# Patient Record
Sex: Female | Born: 1950 | Race: Black or African American | Hispanic: No | Marital: Single | Smoking: Former smoker
Health system: Southern US, Community
[De-identification: ages and names within clinical notes are randomized; demographics above are authoritative.]

## PROBLEM LIST (undated history)

## (undated) DIAGNOSIS — J811 Chronic pulmonary edema: Secondary | ICD-10-CM

## (undated) DIAGNOSIS — Z780 Asymptomatic menopausal state: Secondary | ICD-10-CM

## (undated) DIAGNOSIS — R739 Hyperglycemia, unspecified: Secondary | ICD-10-CM

## (undated) DIAGNOSIS — E7800 Pure hypercholesterolemia, unspecified: Secondary | ICD-10-CM

## (undated) DIAGNOSIS — E119 Type 2 diabetes mellitus without complications: Secondary | ICD-10-CM

## (undated) DIAGNOSIS — M479 Spondylosis, unspecified: Secondary | ICD-10-CM

## (undated) DIAGNOSIS — N281 Cyst of kidney, acquired: Secondary | ICD-10-CM

## (undated) DIAGNOSIS — K219 Gastro-esophageal reflux disease without esophagitis: Secondary | ICD-10-CM

## (undated) DIAGNOSIS — I10 Essential (primary) hypertension: Secondary | ICD-10-CM

## (undated) HISTORY — PX: ABDOMINAL HYSTERECTOMY: SHX81

## (undated) HISTORY — DX: Essential (primary) hypertension: I10

## (undated) HISTORY — DX: Pure hypercholesterolemia, unspecified: E78.00

## (undated) HISTORY — PX: CYST EXCISION: SHX5701

## (undated) HISTORY — DX: Cyst of kidney, acquired: N28.1

## (undated) HISTORY — DX: Chronic pulmonary edema: J81.1

## (undated) HISTORY — PX: TUBAL LIGATION: SHX77

## (undated) HISTORY — DX: Gastro-esophageal reflux disease without esophagitis: K21.9

## (undated) HISTORY — DX: Asymptomatic menopausal state: Z78.0

## (undated) HISTORY — DX: Spondylosis, unspecified: M47.9

## (undated) HISTORY — DX: Hyperglycemia, unspecified: R73.9

## (undated) HISTORY — DX: Type 2 diabetes mellitus without complications: E11.9

---

## 1999-03-28 ENCOUNTER — Emergency Department (HOSPITAL_COMMUNITY): Admission: EM | Admit: 1999-03-28 | Discharge: 1999-03-28 | Payer: Self-pay | Admitting: Emergency Medicine

## 1999-06-19 ENCOUNTER — Emergency Department (HOSPITAL_COMMUNITY): Admission: EM | Admit: 1999-06-19 | Discharge: 1999-06-19 | Payer: Self-pay | Admitting: Emergency Medicine

## 1999-11-20 ENCOUNTER — Encounter: Payer: Self-pay | Admitting: Family Medicine

## 1999-11-20 ENCOUNTER — Encounter: Admission: RE | Admit: 1999-11-20 | Discharge: 1999-11-20 | Payer: Self-pay | Admitting: Family Medicine

## 2000-11-23 ENCOUNTER — Encounter: Admission: RE | Admit: 2000-11-23 | Discharge: 2000-11-23 | Payer: Self-pay | Admitting: Endocrinology

## 2000-11-23 ENCOUNTER — Encounter: Payer: Self-pay | Admitting: Endocrinology

## 2001-03-23 ENCOUNTER — Emergency Department (HOSPITAL_COMMUNITY): Admission: EM | Admit: 2001-03-23 | Discharge: 2001-03-23 | Payer: Self-pay | Admitting: Emergency Medicine

## 2001-11-24 ENCOUNTER — Encounter: Admission: RE | Admit: 2001-11-24 | Discharge: 2001-11-24 | Payer: Self-pay | Admitting: Endocrinology

## 2001-11-24 ENCOUNTER — Encounter: Payer: Self-pay | Admitting: Endocrinology

## 2002-04-02 LAB — HM COLONOSCOPY

## 2002-05-19 ENCOUNTER — Emergency Department (HOSPITAL_COMMUNITY): Admission: EM | Admit: 2002-05-19 | Discharge: 2002-05-19 | Payer: Self-pay | Admitting: Emergency Medicine

## 2002-11-26 ENCOUNTER — Encounter: Payer: Self-pay | Admitting: Endocrinology

## 2002-11-26 ENCOUNTER — Encounter: Admission: RE | Admit: 2002-11-26 | Discharge: 2002-11-26 | Payer: Self-pay | Admitting: Endocrinology

## 2002-12-19 ENCOUNTER — Other Ambulatory Visit: Admission: RE | Admit: 2002-12-19 | Discharge: 2002-12-19 | Payer: Self-pay | Admitting: Endocrinology

## 2003-05-24 ENCOUNTER — Ambulatory Visit (HOSPITAL_COMMUNITY): Admission: RE | Admit: 2003-05-24 | Discharge: 2003-05-24 | Payer: Self-pay | Admitting: Endocrinology

## 2003-05-24 ENCOUNTER — Encounter: Payer: Self-pay | Admitting: Endocrinology

## 2003-11-29 ENCOUNTER — Ambulatory Visit (HOSPITAL_COMMUNITY): Admission: RE | Admit: 2003-11-29 | Discharge: 2003-11-29 | Payer: Self-pay | Admitting: Endocrinology

## 2004-01-06 ENCOUNTER — Other Ambulatory Visit: Admission: RE | Admit: 2004-01-06 | Discharge: 2004-01-06 | Payer: Self-pay | Admitting: Endocrinology

## 2004-02-05 ENCOUNTER — Ambulatory Visit (HOSPITAL_COMMUNITY): Admission: RE | Admit: 2004-02-05 | Discharge: 2004-02-05 | Payer: Self-pay | Admitting: Endocrinology

## 2004-12-28 ENCOUNTER — Ambulatory Visit (HOSPITAL_COMMUNITY): Admission: RE | Admit: 2004-12-28 | Discharge: 2004-12-28 | Payer: Self-pay | Admitting: Endocrinology

## 2005-05-18 ENCOUNTER — Ambulatory Visit: Payer: Self-pay | Admitting: Endocrinology

## 2005-07-08 ENCOUNTER — Ambulatory Visit: Payer: Self-pay | Admitting: Endocrinology

## 2005-07-12 ENCOUNTER — Ambulatory Visit: Payer: Self-pay | Admitting: Endocrinology

## 2005-07-19 ENCOUNTER — Encounter (INDEPENDENT_AMBULATORY_CARE_PROVIDER_SITE_OTHER): Payer: Self-pay | Admitting: *Deleted

## 2005-07-19 ENCOUNTER — Ambulatory Visit: Payer: Self-pay | Admitting: Endocrinology

## 2005-07-19 ENCOUNTER — Other Ambulatory Visit: Admission: RE | Admit: 2005-07-19 | Discharge: 2005-07-19 | Payer: Self-pay | Admitting: Obstetrics and Gynecology

## 2005-10-21 ENCOUNTER — Ambulatory Visit: Payer: Self-pay | Admitting: Endocrinology

## 2005-11-18 ENCOUNTER — Ambulatory Visit: Payer: Self-pay | Admitting: Endocrinology

## 2005-11-19 ENCOUNTER — Ambulatory Visit: Payer: Self-pay

## 2005-12-29 ENCOUNTER — Ambulatory Visit: Payer: Self-pay | Admitting: Endocrinology

## 2006-01-11 ENCOUNTER — Ambulatory Visit: Payer: Self-pay | Admitting: Internal Medicine

## 2006-01-28 ENCOUNTER — Ambulatory Visit (HOSPITAL_COMMUNITY): Admission: RE | Admit: 2006-01-28 | Discharge: 2006-01-28 | Payer: Self-pay | Admitting: Endocrinology

## 2006-04-16 ENCOUNTER — Inpatient Hospital Stay (HOSPITAL_COMMUNITY): Admission: AD | Admit: 2006-04-16 | Discharge: 2006-04-17 | Payer: Self-pay | Admitting: Gynecology

## 2006-06-13 ENCOUNTER — Emergency Department (HOSPITAL_COMMUNITY): Admission: EM | Admit: 2006-06-13 | Discharge: 2006-06-13 | Payer: Self-pay | Admitting: Emergency Medicine

## 2006-08-18 ENCOUNTER — Ambulatory Visit: Payer: Self-pay | Admitting: Endocrinology

## 2006-09-12 ENCOUNTER — Ambulatory Visit: Payer: Self-pay | Admitting: Endocrinology

## 2006-09-12 LAB — CONVERTED CEMR LAB
AST: 18 units/L (ref 0–37)
Albumin: 4.1 g/dL (ref 3.5–5.2)
Alkaline Phosphatase: 98 units/L (ref 39–117)
Basophils Absolute: 0 10*3/uL (ref 0.0–0.1)
Bilirubin Urine: NEGATIVE
CO2: 32 meq/L (ref 19–32)
Calcium: 9.7 mg/dL (ref 8.4–10.5)
Cholesterol: 253 mg/dL (ref 0–200)
Creatinine, Ser: 0.8 mg/dL (ref 0.4–1.2)
GFR calc non Af Amer: 80 mL/min
Glucose, Bld: 94 mg/dL (ref 70–99)
HDL: 67.8 mg/dL (ref >39.0)
Hemoglobin: 14.2 g/dL (ref 12.0–15.0)
MCV: 79.7 fL (ref 78.0–100.0)
Monocytes Absolute: 0.5 10*3/uL (ref 0.2–0.7)
Neutro Abs: 3.6 10*3/uL (ref 1.4–7.7)
Neutrophils Relative %: 60.1 % (ref 43.0–77.0)
Platelets: 248 10*3/uL (ref 150–400)
RBC: 5.34 M/uL — ABNORMAL HIGH (ref 3.87–5.11)
Sodium: 139 meq/L (ref 135–145)
TSH: 1.31 microintl units/mL (ref 0.35–5.50)
Total Bilirubin: 1 mg/dL (ref 0.3–1.2)
Total Protein: 7.4 g/dL (ref 6.0–8.3)
Urine Glucose: NEGATIVE mg/dL
VLDL: 16 mg/dL (ref 0–40)

## 2006-09-16 ENCOUNTER — Ambulatory Visit: Payer: Self-pay | Admitting: Endocrinology

## 2007-02-03 ENCOUNTER — Ambulatory Visit (HOSPITAL_COMMUNITY): Admission: RE | Admit: 2007-02-03 | Discharge: 2007-02-03 | Payer: Self-pay | Admitting: Endocrinology

## 2007-05-25 ENCOUNTER — Encounter: Payer: Self-pay | Admitting: Endocrinology

## 2007-05-25 DIAGNOSIS — I10 Essential (primary) hypertension: Secondary | ICD-10-CM

## 2007-05-25 DIAGNOSIS — K219 Gastro-esophageal reflux disease without esophagitis: Secondary | ICD-10-CM | POA: Insufficient documentation

## 2007-05-29 ENCOUNTER — Ambulatory Visit: Payer: Self-pay | Admitting: Endocrinology

## 2007-05-29 LAB — CONVERTED CEMR LAB
BUN: 14 mg/dL (ref 6–23)
Basophils Relative: 0.5 % (ref 0.0–1.0)
CO2: 32 meq/L (ref 19–32)
Calcium: 9.6 mg/dL (ref 8.4–10.5)
Chloride: 106 meq/L (ref 96–112)
Eosinophils Absolute: 0.2 10*3/uL (ref 0.0–0.6)
GFR calc Af Amer: 113 mL/min
Glucose, Bld: 94 mg/dL (ref 70–99)
HCT: 39.5 % (ref 36.0–46.0)
Hemoglobin: 13.3 g/dL (ref 12.0–15.0)
Hgb A1c MFr Bld: 5.8 % (ref 4.6–6.0)
MCV: 77.5 fL — ABNORMAL LOW (ref 78.0–100.0)
Monocytes Relative: 8.5 % (ref 3.0–11.0)
Neutro Abs: 3.7 10*3/uL (ref 1.4–7.7)
Potassium: 4 meq/L (ref 3.5–5.1)
RBC: 5.09 M/uL (ref 3.87–5.11)
Sed Rate: 10 mm/hr (ref 0–25)
Sodium: 143 meq/L (ref 135–145)
WBC: 6 10*3/uL (ref 4.5–10.5)

## 2007-05-30 ENCOUNTER — Emergency Department (HOSPITAL_COMMUNITY): Admission: EM | Admit: 2007-05-30 | Discharge: 2007-05-30 | Payer: Self-pay | Admitting: Emergency Medicine

## 2007-06-09 ENCOUNTER — Emergency Department (HOSPITAL_COMMUNITY): Admission: EM | Admit: 2007-06-09 | Discharge: 2007-06-09 | Payer: Self-pay | Admitting: Emergency Medicine

## 2007-06-13 ENCOUNTER — Ambulatory Visit: Payer: Self-pay | Admitting: Endocrinology

## 2007-06-27 ENCOUNTER — Ambulatory Visit: Payer: Self-pay | Admitting: Endocrinology

## 2007-07-27 ENCOUNTER — Ambulatory Visit: Payer: Self-pay | Admitting: Endocrinology

## 2007-10-20 ENCOUNTER — Ambulatory Visit: Payer: Self-pay | Admitting: Endocrinology

## 2007-10-20 LAB — CONVERTED CEMR LAB
AST: 16 units/L (ref 0–37)
Albumin: 4.2 g/dL (ref 3.5–5.2)
Alkaline Phosphatase: 92 units/L (ref 39–117)
Basophils Absolute: 0 10*3/uL (ref 0.0–0.1)
Bilirubin Urine: NEGATIVE
CO2: 31 meq/L (ref 19–32)
Calcium: 9.5 mg/dL (ref 8.4–10.5)
Creatinine, Ser: 0.7 mg/dL (ref 0.4–1.2)
Direct LDL: 139.5 mg/dL
Eosinophils Relative: 2.9 % (ref 0.0–5.0)
HCT: 39.9 % (ref 36.0–46.0)
Hemoglobin: 13.2 g/dL (ref 12.0–15.0)
MCV: 79.9 fL (ref 78.0–100.0)
Monocytes Absolute: 0.5 10*3/uL (ref 0.2–0.7)
Neutro Abs: 3.9 10*3/uL (ref 1.4–7.7)
Neutrophils Relative %: 65.4 % (ref 43.0–77.0)
Nitrite: NEGATIVE
Platelets: 208 10*3/uL (ref 150–400)
RBC: 4.99 M/uL (ref 3.87–5.11)
RDW: 13.7 % (ref 11.5–14.6)
Sodium: 139 meq/L (ref 135–145)
Specific Gravity, Urine: >=1.03 (ref 1.000–1.03)
TSH: 0.64 microintl units/mL (ref 0.35–5.50)
Total Bilirubin: 1 mg/dL (ref 0.3–1.2)
Total Protein: 7.3 g/dL (ref 6.0–8.3)
Triglycerides: 74 mg/dL (ref 0–149)
Urobilinogen, UA: 0.2 (ref 0.0–1.0)
WBC: 6 10*3/uL (ref 4.5–10.5)
pH: 5.5 (ref 5.0–8.0)

## 2007-10-22 ENCOUNTER — Emergency Department (HOSPITAL_COMMUNITY): Admission: EM | Admit: 2007-10-22 | Discharge: 2007-10-22 | Payer: Self-pay | Admitting: Emergency Medicine

## 2007-10-24 ENCOUNTER — Ambulatory Visit: Payer: Self-pay | Admitting: Endocrinology

## 2007-10-24 DIAGNOSIS — Z780 Asymptomatic menopausal state: Secondary | ICD-10-CM | POA: Insufficient documentation

## 2007-10-24 DIAGNOSIS — E7800 Pure hypercholesterolemia, unspecified: Secondary | ICD-10-CM

## 2007-10-25 ENCOUNTER — Encounter: Payer: Self-pay | Admitting: Endocrinology

## 2007-10-25 ENCOUNTER — Ambulatory Visit: Payer: Self-pay | Admitting: Internal Medicine

## 2008-02-02 ENCOUNTER — Ambulatory Visit (HOSPITAL_COMMUNITY): Admission: RE | Admit: 2008-02-02 | Discharge: 2008-02-02 | Payer: Self-pay | Admitting: Endocrinology

## 2008-03-13 ENCOUNTER — Ambulatory Visit: Payer: Self-pay | Admitting: Endocrinology

## 2008-03-13 DIAGNOSIS — R51 Headache: Secondary | ICD-10-CM

## 2008-03-13 DIAGNOSIS — R519 Headache, unspecified: Secondary | ICD-10-CM | POA: Insufficient documentation

## 2008-04-01 ENCOUNTER — Ambulatory Visit: Payer: Self-pay | Admitting: Endocrinology

## 2008-04-01 DIAGNOSIS — IMO0001 Reserved for inherently not codable concepts without codable children: Secondary | ICD-10-CM

## 2008-04-01 LAB — CONVERTED CEMR LAB
Calcium: 9.8 mg/dL (ref 8.4–10.5)
Chloride: 104 meq/L (ref 96–112)
Cholesterol: 209 mg/dL (ref 0–200)
Creatinine, Ser: 0.7 mg/dL (ref 0.4–1.2)
Direct LDL: 117.8 mg/dL
GFR calc Af Amer: 112 mL/min
GFR calc non Af Amer: 93 mL/min
Glucose, Bld: 82 mg/dL (ref 70–99)
Sed Rate: 17 mm/hr (ref 0–22)
Total CK: 382 units/L (ref 7–177)

## 2008-06-24 ENCOUNTER — Ambulatory Visit: Payer: Self-pay | Admitting: Endocrinology

## 2008-06-24 ENCOUNTER — Encounter (INDEPENDENT_AMBULATORY_CARE_PROVIDER_SITE_OTHER): Payer: Self-pay | Admitting: *Deleted

## 2008-06-24 DIAGNOSIS — R079 Chest pain, unspecified: Secondary | ICD-10-CM

## 2008-06-24 LAB — CONVERTED CEMR LAB
Eosinophils Absolute: 0.2 10*3/uL (ref 0.0–0.7)
Hemoglobin: 13.1 g/dL (ref 12.0–15.0)
Lymphocytes Relative: 22.8 % (ref 12.0–46.0)
MCV: 79.1 fL (ref 78.0–100.0)
Monocytes Absolute: 0.6 10*3/uL (ref 0.1–1.0)
RBC: 4.86 M/uL (ref 3.87–5.11)
WBC: 6.7 10*3/uL (ref 4.5–10.5)

## 2008-06-27 ENCOUNTER — Ambulatory Visit: Payer: Self-pay

## 2008-07-25 ENCOUNTER — Ambulatory Visit: Payer: Self-pay | Admitting: Internal Medicine

## 2008-07-25 DIAGNOSIS — R05 Cough: Secondary | ICD-10-CM

## 2008-07-29 ENCOUNTER — Encounter (INDEPENDENT_AMBULATORY_CARE_PROVIDER_SITE_OTHER): Payer: Self-pay | Admitting: *Deleted

## 2008-08-02 ENCOUNTER — Telehealth (INDEPENDENT_AMBULATORY_CARE_PROVIDER_SITE_OTHER): Payer: Self-pay | Admitting: *Deleted

## 2008-08-02 ENCOUNTER — Encounter: Payer: Self-pay | Admitting: Endocrinology

## 2008-08-19 ENCOUNTER — Ambulatory Visit: Payer: Self-pay | Admitting: Endocrinology

## 2008-09-04 ENCOUNTER — Ambulatory Visit: Payer: Self-pay | Admitting: Internal Medicine

## 2008-10-21 ENCOUNTER — Ambulatory Visit: Payer: Self-pay | Admitting: Internal Medicine

## 2008-10-21 DIAGNOSIS — R109 Unspecified abdominal pain: Secondary | ICD-10-CM

## 2008-10-21 LAB — CONVERTED CEMR LAB
Bacteria, UA: NEGATIVE
Bilirubin Urine: NEGATIVE
Hemoglobin, Urine: NEGATIVE
Ketones, ur: NEGATIVE mg/dL
Leukocytes, UA: NEGATIVE
Nitrite: NEGATIVE
RBC / HPF: NONE SEEN
Specific Gravity, Urine: 1.01 (ref 1.000–1.03)
Total Protein, Urine: NEGATIVE mg/dL

## 2009-02-06 ENCOUNTER — Ambulatory Visit: Payer: Self-pay | Admitting: Endocrinology

## 2009-02-06 LAB — CONVERTED CEMR LAB
AST: 16 units/L (ref 0–37)
Albumin: 4.3 g/dL (ref 3.5–5.2)
Alkaline Phosphatase: 94 units/L (ref 39–117)
BUN: 14 mg/dL (ref 6–23)
Bilirubin Urine: NEGATIVE
CO2: 30 meq/L (ref 19–32)
Calcium: 9.7 mg/dL (ref 8.4–10.5)
Chloride: 104 meq/L (ref 96–112)
Direct LDL: 160 mg/dL
Eosinophils Absolute: 0.2 10*3/uL (ref 0.0–0.7)
Eosinophils Relative: 3.3 % (ref 0.0–5.0)
GFR calc non Af Amer: 111.51 mL/min (ref >60)
Glucose, Bld: 85 mg/dL (ref 70–99)
HDL: 71.5 mg/dL (ref >39.00)
Ketones, ur: NEGATIVE mg/dL
Lymphs Abs: 1.2 10*3/uL (ref 0.7–4.0)
MCV: 79.2 fL (ref 78.0–100.0)
Monocytes Absolute: 0.5 10*3/uL (ref 0.1–1.0)
Monocytes Relative: 9.1 % (ref 3.0–12.0)
Neutrophils Relative %: 65.3 % (ref 43.0–77.0)
Potassium: 3.7 meq/L (ref 3.5–5.1)
RDW: 13.9 % (ref 11.5–14.6)
Sodium: 141 meq/L (ref 135–145)
Total Bilirubin: 1 mg/dL (ref 0.3–1.2)
Total Protein: 7.3 g/dL (ref 6.0–8.3)
Urine Glucose: NEGATIVE mg/dL
Urobilinogen, UA: 0.2 (ref 0.0–1.0)

## 2009-02-07 ENCOUNTER — Other Ambulatory Visit: Admission: RE | Admit: 2009-02-07 | Discharge: 2009-02-07 | Payer: Self-pay | Admitting: Endocrinology

## 2009-02-07 ENCOUNTER — Ambulatory Visit: Payer: Self-pay | Admitting: Endocrinology

## 2009-02-07 ENCOUNTER — Encounter: Payer: Self-pay | Admitting: Endocrinology

## 2009-02-13 ENCOUNTER — Ambulatory Visit (HOSPITAL_COMMUNITY): Admission: RE | Admit: 2009-02-13 | Discharge: 2009-02-13 | Payer: Self-pay | Admitting: Endocrinology

## 2009-03-07 ENCOUNTER — Emergency Department (HOSPITAL_COMMUNITY): Admission: EM | Admit: 2009-03-07 | Discharge: 2009-03-07 | Payer: Self-pay | Admitting: Emergency Medicine

## 2009-03-11 ENCOUNTER — Ambulatory Visit: Payer: Self-pay | Admitting: Endocrinology

## 2009-03-11 DIAGNOSIS — N281 Cyst of kidney, acquired: Secondary | ICD-10-CM

## 2009-03-11 DIAGNOSIS — M479 Spondylosis, unspecified: Secondary | ICD-10-CM | POA: Insufficient documentation

## 2009-04-16 ENCOUNTER — Ambulatory Visit: Payer: Self-pay | Admitting: Internal Medicine

## 2009-04-16 ENCOUNTER — Encounter (INDEPENDENT_AMBULATORY_CARE_PROVIDER_SITE_OTHER): Payer: Self-pay | Admitting: *Deleted

## 2009-04-21 ENCOUNTER — Ambulatory Visit: Payer: Self-pay | Admitting: Internal Medicine

## 2009-04-25 ENCOUNTER — Ambulatory Visit: Payer: Self-pay | Admitting: Cardiology

## 2009-05-02 ENCOUNTER — Ambulatory Visit: Payer: Self-pay | Admitting: Endocrinology

## 2009-05-05 LAB — CONVERTED CEMR LAB
ALT: 17 units/L (ref 0–35)
Albumin: 4.2 g/dL (ref 3.5–5.2)
Alkaline Phosphatase: 89 units/L (ref 39–117)
Cholesterol: 137 mg/dL (ref 0–200)
HDL: 67.9 mg/dL (ref >39.00)
Total Bilirubin: 1 mg/dL (ref 0.3–1.2)
Total CHOL/HDL Ratio: 2

## 2009-09-02 ENCOUNTER — Ambulatory Visit: Payer: Self-pay | Admitting: Internal Medicine

## 2009-09-02 DIAGNOSIS — J029 Acute pharyngitis, unspecified: Secondary | ICD-10-CM

## 2009-09-07 ENCOUNTER — Emergency Department (HOSPITAL_COMMUNITY): Admission: EM | Admit: 2009-09-07 | Discharge: 2009-09-07 | Payer: Self-pay | Admitting: *Deleted

## 2009-10-07 ENCOUNTER — Encounter: Admission: RE | Admit: 2009-10-07 | Discharge: 2009-10-07 | Payer: Self-pay | Admitting: Internal Medicine

## 2009-12-03 ENCOUNTER — Ambulatory Visit: Payer: Self-pay | Admitting: Endocrinology

## 2009-12-05 ENCOUNTER — Encounter: Payer: Self-pay | Admitting: Endocrinology

## 2009-12-11 ENCOUNTER — Ambulatory Visit: Payer: Self-pay | Admitting: Endocrinology

## 2010-07-03 ENCOUNTER — Ambulatory Visit: Payer: Self-pay | Admitting: Endocrinology

## 2010-07-03 ENCOUNTER — Encounter: Payer: Self-pay | Admitting: Endocrinology

## 2010-07-03 LAB — CONVERTED CEMR LAB
ALT: 17 units/L (ref 0–35)
BUN: 17 mg/dL (ref 6–23)
Basophils Relative: 0.4 % (ref 0.0–3.0)
Bilirubin Urine: NEGATIVE
CO2: 31 meq/L (ref 19–32)
Calcium: 10 mg/dL (ref 8.4–10.5)
Creatinine, Ser: 0.8 mg/dL (ref 0.4–1.2)
Eosinophils Absolute: 0.2 10*3/uL (ref 0.0–0.7)
Eosinophils Relative: 1.5 % (ref 0.0–5.0)
GFR calc non Af Amer: 93.75 mL/min (ref >60)
HCT: 41.4 % (ref 36.0–46.0)
Hemoglobin: 14 g/dL (ref 12.0–15.0)
LDL Cholesterol: 77 mg/dL (ref 0–99)
Leukocytes, UA: NEGATIVE
MCV: 78.6 fL (ref 78.0–100.0)
Monocytes Absolute: 0.8 10*3/uL (ref 0.1–1.0)
Monocytes Relative: 8.3 % (ref 3.0–12.0)
Neutrophils Relative %: 67.3 % (ref 43.0–77.0)
Nitrite: NEGATIVE
Potassium: 3.7 meq/L (ref 3.5–5.1)
RDW: 15.4 % — ABNORMAL HIGH (ref 11.5–14.6)
Specific Gravity, Urine: >=1.03 (ref 1.000–1.030)
Total Bilirubin: 1 mg/dL (ref 0.3–1.2)
VLDL: 11.6 mg/dL (ref 0.0–40.0)
WBC: 9.8 10*3/uL (ref 4.5–10.5)

## 2010-10-08 ENCOUNTER — Ambulatory Visit: Payer: Self-pay | Admitting: Endocrinology

## 2010-11-09 IMAGING — CT CT PELVIS W/O CM
2 of 4 series · 17 of 46 positions shown, 19 images · non-contrast
Comparison: None available

CT ABDOMEN

CLINICAL DATA: Left flank pain.  Left lower quadrant pain.

CT OF THE ABDOMEN AND PELVIS WITHOUT CONTRAST (CT UROGRAM)
TECHNIQUE: Multidetector CT imaging was performed through the
abdomen and pelvis to include the urinary tract.

[Series 2: under 200# stone no prev · axial · 0.60mm/px · z∈[+918,+1273]mm · 14 of 77 slices shown, 16 images]
[im 3/77  soft-tissue]
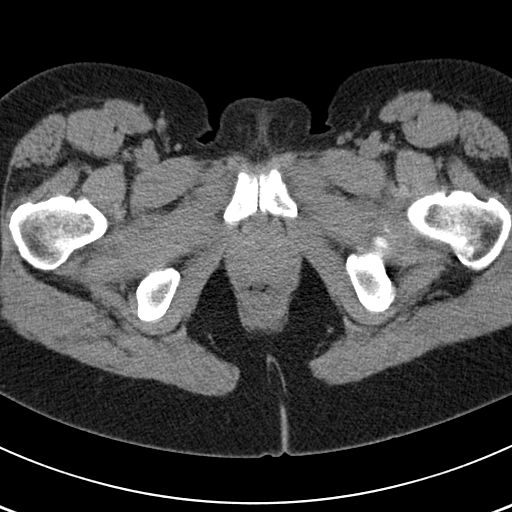
[im 3/77  bone]
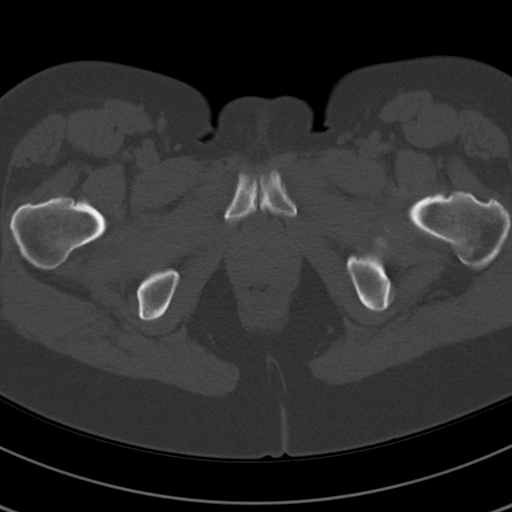
[im 9/77  soft-tissue]
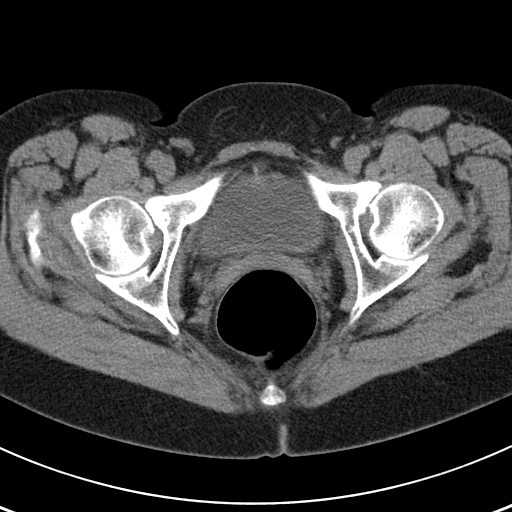
[im 15/77  soft-tissue]
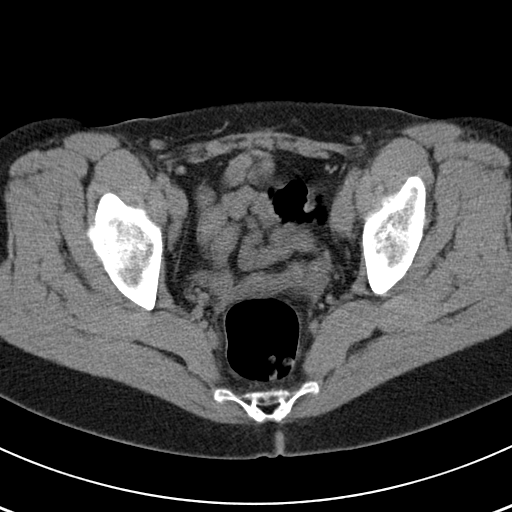
[im 21/77  soft-tissue]
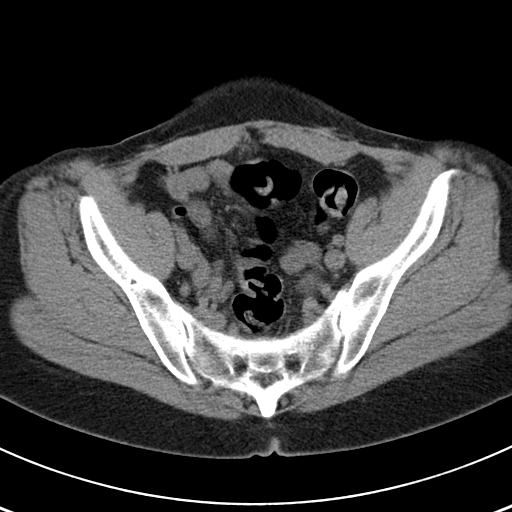
[im 27/77  soft-tissue]
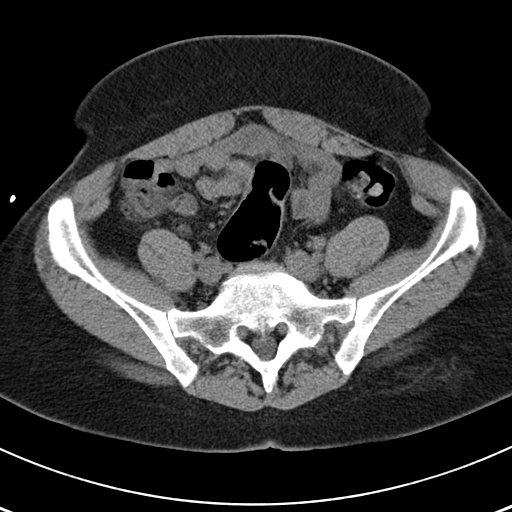
[im 30/77  soft-tissue]
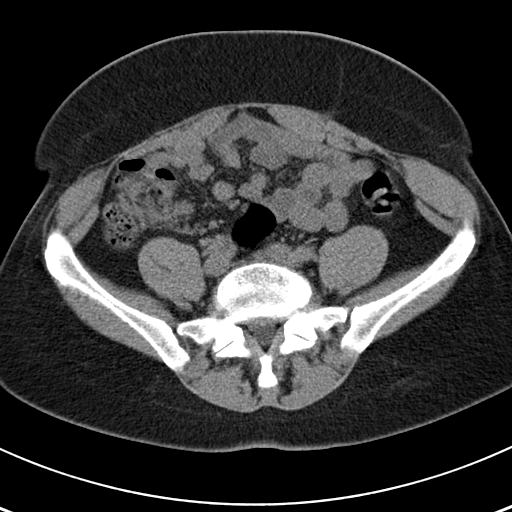
[im 36/77  soft-tissue]
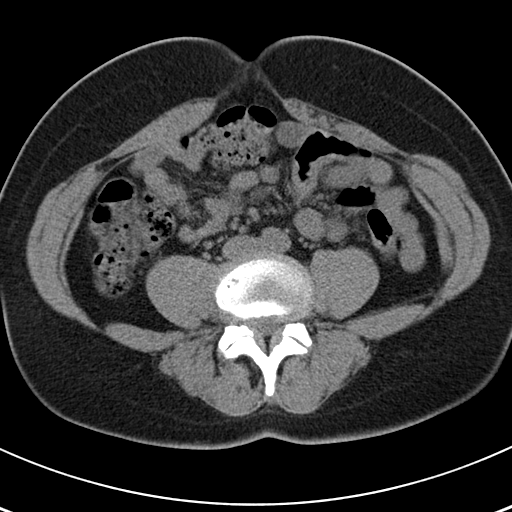
[im 41/77  soft-tissue]
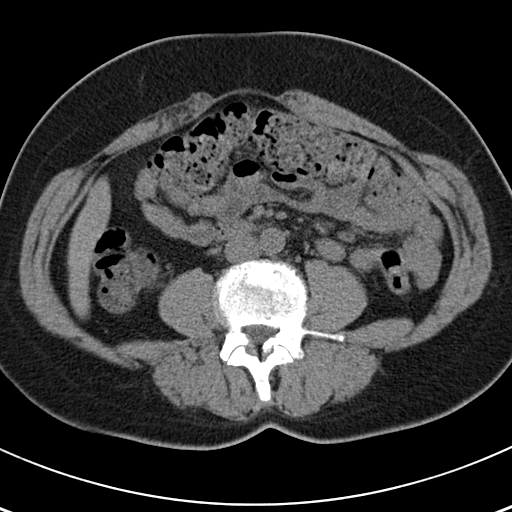
[im 47/77  soft-tissue]
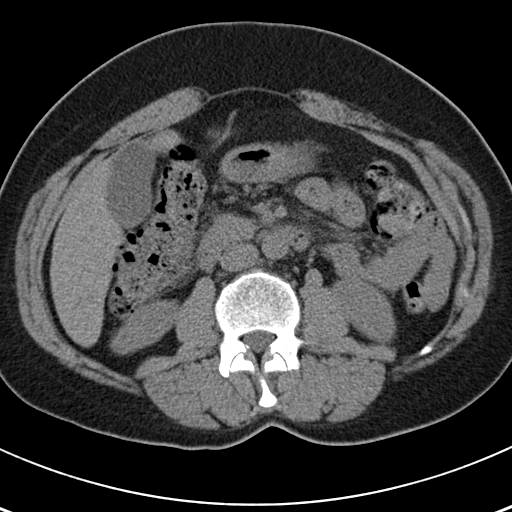
[im 47/77  bone]
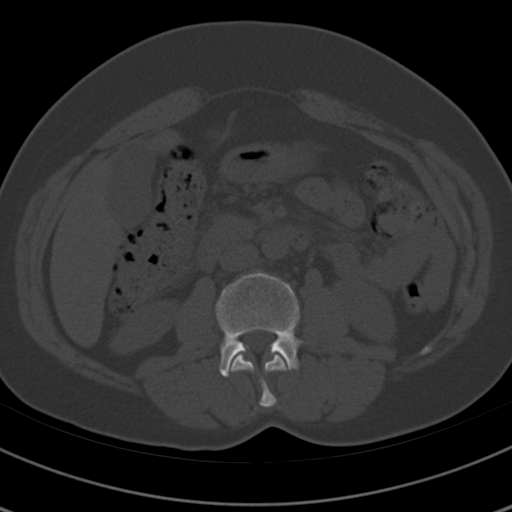
[im 50/77  soft-tissue]
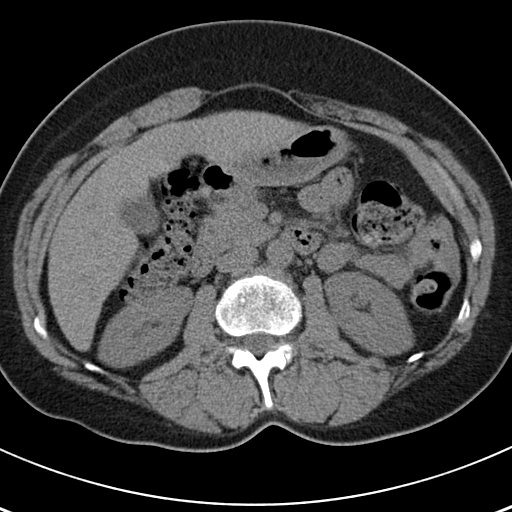
[im 56/77  soft-tissue]
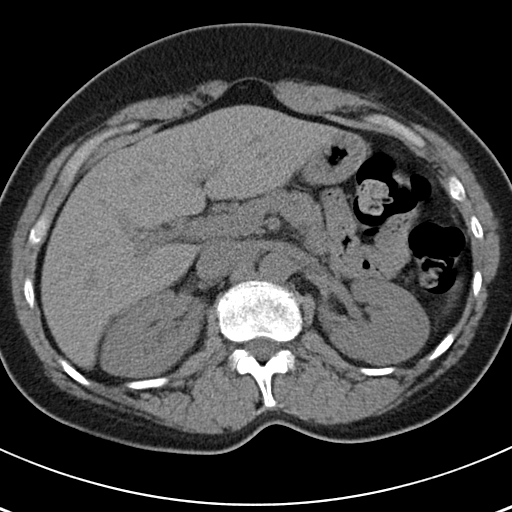
[im 62/77  soft-tissue]
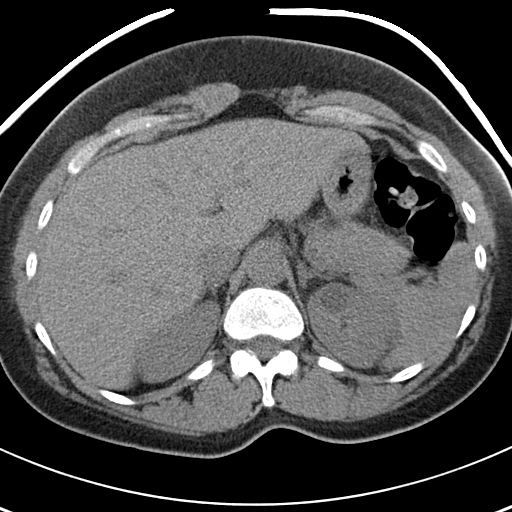
[im 68/77  soft-tissue]
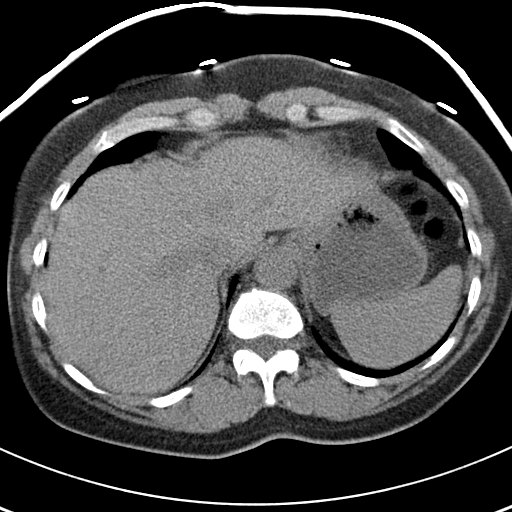
[im 74/77  soft-tissue]
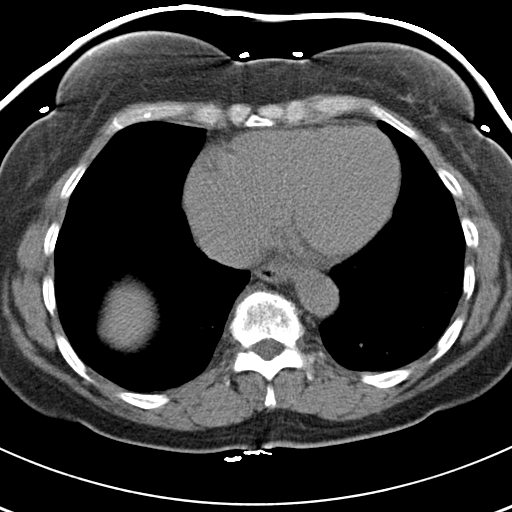

[Series 602: <mpr thick range> · coronal · 0.78mm/px · 3 of 70 slices shown]
[im 24/70  soft-tissue]
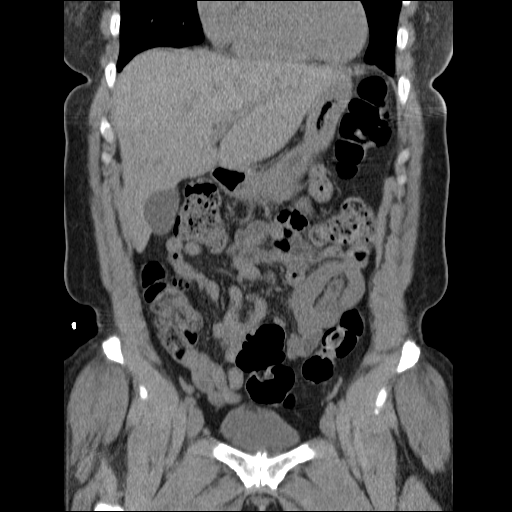
[im 31/70  soft-tissue]
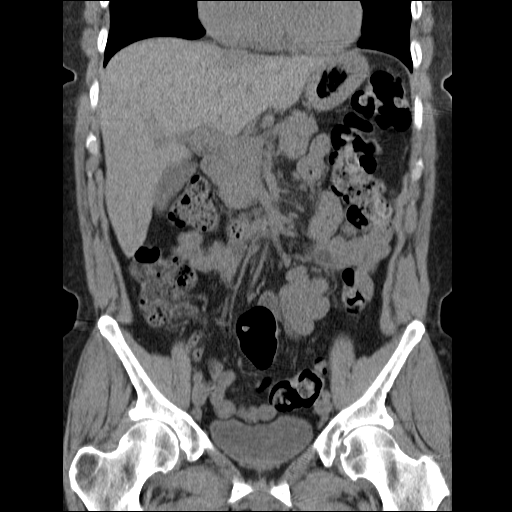
[im 39/70  soft-tissue]
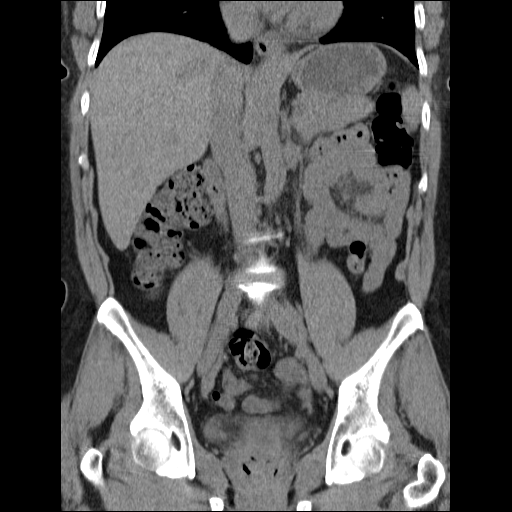

[17 of 46 positions shown; findings below may reference images not displayed]

FINDINGS: Lung bases show dependent atelectasis.  Borderline heart
size.  Liver appears within normal limits.  Both adrenal glands
appear normal.  No renal stones or obstruction.  2 cm left upper
pole renal low density lesion is present, statistically likely
representing a cyst. Unenhanced CT was performed per clinician
order.  Lack of IV contrast limits sensitivity and specificity,
especially for evaluation of abdominal/pelvic solid viscera. .
Gallbladder within normal limits.

Both ureters appear within normal limits. No ureteral calculi are
urolithiasis is present.  Spleen appears within normal limits.
Pancreas is unremarkable.  Stomach and proximal small bowel normal.
No free air.
IMPRESSION: 1.  No acute abdominal abnormality.
2.  Left upper pole low density renal lesion statistically likely
represent simple cyst.

CT PELVIS
FINDINGS: Normal appendix.  Urinary bladder appears within normal
limits.  Uterus surgically absent.  Large fecal burden is present.
No free fluid in the anatomic pelvis.  Bones appear within normal
limits aside from mild left greater than right SI joint
degenerative disease and lumbar spondylosis which is most severe at
L3-L4 and L4-L5.  Pubic symphysial degenerative disease.  No pelvic
lymphadenopathy.
IMPRESSION: 1.  No acute pelvic abnormality.
2.  Hysterectomy.
3.  Large fecal burden.

## 2010-11-15 ENCOUNTER — Encounter: Payer: Self-pay | Admitting: Endocrinology

## 2010-11-24 NOTE — Assessment & Plan Note (Signed)
Summary: TB SKIN TEST-LB   Nurse Visit   Allergies: No Known Drug Allergies  Immunizations Administered:  PPD Skin Test:    Vaccine Type: PPD    Site: left forearm    Mfr: Sanofi Pasteur    Dose: 0.1 ml    Route: ID    Given by: Lucious Groves    Exp. Date: 02/18/2012    Lot #: [id number]  PPD Results    Date of reading: 12/05/2009    Results: < 5mm    Interpretation: negative  Orders Added: 1)  TB Skin Test [86580] 2)  Admin 1st Vaccine [99371]

## 2010-11-24 NOTE — Letter (Signed)
Summary: TB Skin Test  All     ,     Phone:   Fax:           TB Skin Test    [facility]     Date TB Test Placed:  12/03/2009 L  forearm  TB Test Placed by:  Lucious Groves, CMA  Lot #:  [id number]       Expiration Date: 02/18/2012  Date TB Test Read:  12/05/2009  Result  <  TB Test Read by:  Lucious Groves, CMA

## 2010-11-24 NOTE — Assessment & Plan Note (Signed)
Summary: F/U APPT,MEDS/#/CD   Vital Signs:  Patient profile:   60 year old female Height:      62 inches (157.48 cm) Weight:      167.25 pounds (76.02 kg) BMI:     30.70 O2 Sat:      96 % on Room air Temp:     97.2 degrees F (36.22 degrees C) oral Pulse rate:   75 / minute BP sitting:   122 / 80  (left arm) Cuff size:   regular  Vitals Entered By: Brenton Grills MA (July 03, 2010 3:17 PM)  O2 Flow:  Room air CC: F/U appt/refiils on meds/aj Is Patient Diabetic? Yes   Primary Provider:  Everardo All  CC:  F/U appt/refiils on meds/aj.  History of Present Illness: here for regular wellness examination.  He's feeling pretty well in general, and does not drink or smoke.   Current Medications (verified): 1)  Zantac 150 Mg  Tabs (Ranitidine Hcl) .... Take At Bedtime 2)  Calcium 1250 Mg  Tabs (Calcium Carbonate) .... Take 1 By Mouth Daily 3)  Adult Aspirin Ec Low Strength 81 Mg Tbec (Aspirin) .Marland Kitchen.. 1po Once Daily 4)  Tramadol-Acetaminophen 37.5-325 Mg Tabs (Tramadol-Acetaminophen) .Marland Kitchen.. 1 Q4h As Needed Pain 5)  Nasonex 50 Mcg/act Susp (Mometasone Furoate) .Marland Kitchen.. 1 Spray Each Nostril Each Am 6)  Claritin 10 Mg Tabs (Loratadine) .Marland Kitchen.. 1 By Mouth Once Daily 7)  Hyzaar 100-25 Mg Tabs (Losartan Potassium-Hctz) .Marland Kitchen.. 1 Qd 8)  Simvastatin 80 Mg Tabs (Simvastatin) .Marland Kitchen.. 1 Qhs  Allergies (verified): No Known Drug Allergies  Past History:  Past Medical History: Last updated: 09/04/2008 Hypertension Smoker (Quit 1999)     - PFTs 09/04/08 FEV1 97% ratio 83% diffusing capacity 106% Dyslipidemia Non-Cardiogenic Menopause Hyperglycemia GERD  Past Surgical History: Hysterectomy (no bso) Tubal ligation EGD (04/02/2002) Stress Cardiolite (01/10/2004) Lower Venous (11/19/2005)  Family History: Reviewed history from 10/21/2008 and no changes required. mother had breast cancer, renal stones father with HTN brother died 08/31/23 with "liver cancer"  Social History: Reviewed history from  10/21/2008 and no changes required. 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  The patient denies fever, vision loss, decreased hearing, chest pain, syncope, dyspnea on exertion, prolonged cough, headaches, abdominal pain, melena, hematochezia, severe indigestion/heartburn, hematuria, suspicious skin lesions, and depression.    Physical Exam  General:  normal appearance.   Head:  head: no deformity eyes: no periorbital swelling, no proptosis external nose and ears are normal mouth: no lesion seen Neck:  Supple without thyroid enlargement or tenderness.  Breasts:  No tenderness, masses, nipple discharge, or skin abnormalities.  Lungs:  Clear to auscultation bilaterally. Normal respiratory effort.  Heart:  Regular rate and rhythm without murmurs or gallops noted. Normal S1,S2.   Abdomen:  abdomen is soft, nontender.  no hepatosplenomegaly.   not distended.  no hernia  Rectal:  normal external and internal exam.  heme neg  Genitalia:  Pelvic Exam:        External: normal female genitalia without lesions or masses        Vagina: normal without lesions or masses        Adnexa: normal bimanual exam without masses or fullness Msk:  muscle bulk and strength are grossly normal.  no obvious joint swelling.  gait is normal and steady  Pulses:  dorsalis pedis intact bilat.  no carotid bruit  Extremities:  no deformity.  no ulcer on the feet.  feet are of normal color and temp.  no edema  Neurologic:  cn 2-12 grossly intact.   readily moves all 4's.   sensation is intact to touch on the feet  Skin:  normal texture and temp.  no rash.  not diaphoretic  Cervical Nodes:  No significant adenopathy.  Psych:  Alert and cooperative; normal mood and affect; normal attention span and concentration.     Impression & Recommendations:  Problem # 1:  ROUTINE GENERAL MEDICAL EXAM@HEALTH  CARE FACL  (ICD-V70.0)  Problem # 2:  HYPERTENSION (ICD-401.9) well-controlled, but she is only on benicar-hct 40/12.5 samples, 1/day  Medications Added to Medication List This Visit: 1)  Losartan Potassium-hctz 100-12.5 Mg Tabs (Losartan potassium-hctz) .Marland Kitchen.. 1 once daily  Other Orders: EKG w/ Interpretation (93000) TLB-Lipid Panel (80061-LIPID) TLB-BMP (Basic Metabolic Panel-BMET) (80048-METABOL) TLB-CBC Platelet - w/Differential (85025-CBCD) TLB-Hepatic/Liver Function Pnl (80076-HEPATIC) TLB-TSH (Thyroid Stimulating Hormone) (84443-TSH) TLB-Udip w/ Micro (81001-URINE) Est. Patient 40-64 years (60737)  Patient Instructions: 1)  please consider these measures for your health:  minimize alcohol.  do not use tobacco products.  have a colonoscopy at least every 10 years from age 21.  keep firearms safely stored.  always use seat belts.  have working smoke alarms in your home.  see an eye doctor and dentist regularly.  never drive under the influence of alcohol or drugs (including prescription drugs).  2)  please let me know what your wishes would be, if artificial life support measures should become necessary.  it is critically important to prevent falling down (keep floor areas well-lit, dry, and free of loose objects). 3)  reduce losartan-hctz to 100/12.5, 1/day. 4)  Please schedule a follow-up appointment in 1 year. Prescriptions: LOSARTAN POTASSIUM-HCTZ 100-12.5 MG TABS (LOSARTAN POTASSIUM-HCTZ) 1 once daily  #90 x 3   Entered and Authorized by:   Minus Breeding MD   Signed by:   Minus Breeding MD on 07/03/2010   Method used:   Print then Give to Patient   RxID:   [id number] SIMVASTATIN 80 MG TABS (SIMVASTATIN) 1 qhs  #90 x 3   Entered and Authorized by:   Minus Breeding MD   Signed by:   Minus Breeding MD on 07/03/2010   Method used:   Print then Give to Patient   RxID:   [id number] VELFYB 100-25 MG TABS (LOSARTAN POTASSIUM-HCTZ) 1 qd  #90 x 3   Entered and Authorized by:   Minus Breeding MD   Signed by:   Minus Breeding MD on 07/03/2010   Method used:   Print then Give to Patient   RxID:   [id number] SIMVASTATIN 80 MG TABS (SIMVASTATIN) 1 qhs  #90 x 3   Entered and Authorized by:   Minus Breeding MD   Signed by:   Minus Breeding MD on 07/03/2010   Method used:   Print then Give to Patient   RxID:   [id number]

## 2010-11-24 NOTE — Assessment & Plan Note (Signed)
Summary: f/u appt per pt/#/cd   Vital Signs:  Patient profile:   60 year old female Height:      62 inches (157.48 cm) Weight:      173.13 pounds (78.70 kg) O2 Sat:      97 % on Room air Temp:     97.9 degrees F (36.61 degrees C) oral Pulse rate:   76 / minute BP sitting:   108 / 82  (left arm) Cuff size:   regular  Vitals Entered By: Josph Macho RMA (December 11, 2009 2:31 PM)  O2 Flow:  Room air CC: Follow-up visit/ pt states she has had headaches X2weeks and sharp chest pains under left breast X2weeks/ CF Is Patient Diabetic? No   Primary Provider:  Everardo All  CC:  Follow-up visit/ pt states she has had headaches X2weeks and sharp chest pains under left breast X2weeks/ CF.  History of Present Illness: pt states 2 weeks of intermittent pain under the left breast, lasting a few seconds at a time.  no associated sxs.  it is non-exertional, and is not precip by deep breathing or twisting the torso. she wants cheaper cholesterol med, as she will lose her insurance sonn, x 3 months  Current Medications (verified): 1)  Zantac 150 Mg  Tabs (Ranitidine Hcl) .... Take At Bedtime 2)  Calcium 1250 Mg  Tabs (Calcium Carbonate) .... Take 1 By Mouth Daily 3)  Adult Aspirin Ec Low Strength 81 Mg Tbec (Aspirin) .Marland Kitchen.. 1po Once Daily 4)  Crestor 40 Mg Tabs (Rosuvastatin Calcium) .Marland Kitchen.. 1 Qhs 5)  Tramadol-Acetaminophen 37.5-325 Mg Tabs (Tramadol-Acetaminophen) .Marland Kitchen.. 1 Q4h As Needed Pain 6)  Nasonex 50 Mcg/act Susp (Mometasone Furoate) .Marland Kitchen.. 1 Spray Each Nostril Each Am 7)  Claritin 10 Mg Tabs (Loratadine) .Marland Kitchen.. 1 By Mouth Once Daily 8)  Hyzaar 100-25 Mg Tabs (Losartan Potassium-Hctz) .Marland Kitchen.. 1 Qd  Allergies (verified): No Known Drug Allergies  Past History:  Past Medical History: Last updated: 09/04/2008 Hypertension Smoker (Quit 1999)     - PFTs 09/04/08 FEV1 97% ratio 83% diffusing capacity 106% Dyslipidemia Non-Cardiogenic Menopause Hyperglycemia GERD  Review of Systems  The  patient denies syncope and dyspnea on exertion.    Physical Exam  General:  normal appearance.   Chest Wall:  nontender Lungs:  Clear to auscultation bilaterally. Normal respiratory effort.  Heart:  Regular rate and rhythm without murmurs or gallops noted. Normal S1,S2.     Impression & Recommendations:  Problem # 1:  CHEST PAIN, LEFT (ICD-786.50) uncertain etiology  Problem # 2:  HYPERCHOLESTEROLEMIA (ICD-272.0) therapy limited by pt's request for least expensive meds  Problem # 3:  HYPERTENSION (ICD-401.9) overcontrolled  Medications Added to Medication List This Visit: 1)  Simvastatin 80 Mg Tabs (Simvastatin) .Marland Kitchen.. 1 qhs  Other Orders: EKG w/ Interpretation (93000) Cardiolite (Cardiolite) Est. Patient Level IV (52841)  Patient Instructions: 1)  treadmill test.  you will be called for an appointment. 2)  change crestor to simvastatin 80 mg each night 3)  skip hyzaar x 1 day, then start these samples of benicar-hct 40/12.5, 1/day. 4)  come in for a physical when you get your insurance back, in 3 months. 5)  aspirin 1/day. Prescriptions: SIMVASTATIN 80 MG TABS (SIMVASTATIN) 1 qhs  #90 x 1   Entered and Authorized by:   Minus Breeding MD   Signed by:   Minus Breeding MD on 12/11/2009   Method used:   Print then Give to Patient   RxID:   [id number]

## 2010-11-26 NOTE — Assessment & Plan Note (Signed)
Summary: HEADACHE/NWS   Vital Signs:  Patient profile:   60 year old female Height:      62 inches (157.48 cm) Weight:      167.38 pounds (76.08 kg) BMI:     30.72 O2 Sat:      95 % on Room air Temp:     98.9 degrees F (37.17 degrees C) oral Pulse rate:   82 / minute BP sitting:   124 / 86  (left arm) Cuff size:   regular  Vitals Entered By: Brenton Grills CMA Duncan Dull) (October 08, 2010 3:56 PM)  O2 Flow:  Room air CC: Sinus infection? (HA x 3 days, post nasal drip)/aj Is Patient Diabetic? No   Primary Provider:  Everardo All  CC:  Sinus infection? (HA x 3 days and post nasal drip)/aj.  History of Present Illness: pt states few days of slight pain at the bifrontal areas, and assoc nasal congestion.    Current Medications (verified): 1)  Zantac 150 Mg  Tabs (Ranitidine Hcl) .... Take At Bedtime 2)  Calcium 1250 Mg  Tabs (Calcium Carbonate) .... Take 1 By Mouth Daily 3)  Adult Aspirin Ec Low Strength 81 Mg Tbec (Aspirin) .Marland Kitchen.. 1po Once Daily 4)  Tramadol-Acetaminophen 37.5-325 Mg Tabs (Tramadol-Acetaminophen) .Marland Kitchen.. 1 Q4h As Needed Pain 5)  Nasonex 50 Mcg/act Susp (Mometasone Furoate) .Marland Kitchen.. 1 Spray Each Nostril Each Am 6)  Claritin 10 Mg Tabs (Loratadine) .Marland Kitchen.. 1 By Mouth Once Daily 7)  Simvastatin 80 Mg Tabs (Simvastatin) .Marland Kitchen.. 1 Qhs 8)  Losartan Potassium-Hctz 100-12.5 Mg Tabs (Losartan Potassium-Hctz) .Marland Kitchen.. 1 Once Daily  Allergies (verified): No Known Drug Allergies  Past History:  Past Medical History: Last updated: 09/04/2008 Hypertension Smoker (Quit 1999)     - PFTs 09/04/08 FEV1 97% ratio 83% diffusing capacity 106% Dyslipidemia Non-Cardiogenic Menopause Hyperglycemia GERD  Review of Systems  The patient denies fever.         denies earache  Physical Exam  General:  normal appearance.   Head:  head: no deformity eyes: no periorbital swelling, no proptosis external nose and ears are normal mouth: no lesion seen Ears:  TM's intact and clear with normal  canals with grossly normal hearing.     Impression & Recommendations:  Problem # 1:  uri new  Problem # 2:  HEADACHE (ICD-784.0) prob due to #1  Medications Added to Medication List This Visit: 1)  Azithromycin 500 Mg Tabs (Azithromycin) .Marland Kitchen.. 1 tab once daily 2)  Benzonatate 200 Mg Caps (Benzonatate) .Marland Kitchen.. 1 tab three times a day as needed for cough  Other Orders: Est. Patient Level III (16109)  Patient Instructions: 1)  azithromycin 500 mg once daily 2)  take loratadine-d (non-prescription) as needed for congestion 3)  benzonatate 200 mg three times a day as needed for cough 4)  here are some samples of benicar-hct, 40/12.5.  take 1/day, instead of losartan-hct 100/12.5.  when you run out, return to the losartan-hct Prescriptions: AZITHROMYCIN 500 MG TABS (AZITHROMYCIN) 1 tab once daily  #6 x 0   Entered and Authorized by:   Minus Breeding MD   Signed by:   Minus Breeding MD on 10/08/2010   Method used:   Print then Give to Patient   RxID:   [id number] BENZONATATE 200 MG CAPS (BENZONATATE) 1 tab three times a day as needed for cough  #30 x 1   Entered and Authorized by:   Minus Breeding MD   Signed by:   Minus Breeding MD  on 10/08/2010   Method used:   Print then Give to Patient   RxID:   [id number] AZITHROMYCIN 500 MG TABS (AZITHROMYCIN) 1 tab once daily  #6 x 0   Entered and Authorized by:   Minus Breeding MD   Signed by:   Minus Breeding MD on 10/08/2010   Method used:   Electronically to        [facility] [phone]* (retail)       [address]       [place], [place]  [address]       Ph: [phone]       Fax: [phone]   RxID:   [id number]    Orders Added: 1)  Est. Patient Level III [33295]    Preventive Care Screening  Mammogram:    Date:  10/25/2009    Results:  normal

## 2011-01-12 ENCOUNTER — Ambulatory Visit (INDEPENDENT_AMBULATORY_CARE_PROVIDER_SITE_OTHER): Payer: 59 | Admitting: Endocrinology

## 2011-01-12 DIAGNOSIS — Z111 Encounter for screening for respiratory tuberculosis: Secondary | ICD-10-CM

## 2011-01-12 DIAGNOSIS — D509 Iron deficiency anemia, unspecified: Secondary | ICD-10-CM

## 2011-01-12 MED ORDER — CYANOCOBALAMIN 1000 MCG/ML IJ SOLN: 1000.0000 ug | Freq: Once | INTRAMUSCULAR | Status: DC

## 2011-01-13 MED ORDER — TUBERCULIN PPD 5 UNIT/0.1ML ID SOLN
5.0000 [IU] | Freq: Once | INTRADERMAL | Status: AC
  Administered 2011-01-12: 5 [IU] via INTRADERMAL

## 2011-01-14 LAB — TB SKIN TEST: TB Skin Test: NEGATIVE mm

## 2011-01-17 NOTE — Progress Notes (Deleted)
  Subjective:    Patient ID: Danielle Patterson, female    DOB: 06/17/1951, 60 y.o.   MRN: [id number]  HPI    Review of Systems     Objective:   Physical Exam        Assessment & Plan:

## 2011-01-18 NOTE — Progress Notes (Signed)
Pt came into clinic 01/14/2011 to have PPD resulted. Normal result no induration.

## 2011-01-20 NOTE — Progress Notes (Signed)
A user error has taken place: encounter opened in error, closed for administrative reasons.

## 2011-02-02 LAB — URINALYSIS, ROUTINE W REFLEX MICROSCOPIC
Ketones, ur: NEGATIVE mg/dL
Nitrite: NEGATIVE
Protein, ur: NEGATIVE mg/dL
Urobilinogen, UA: 1 mg/dL (ref 0.0–1.0)

## 2011-02-02 LAB — URINE CULTURE: Culture: NO GROWTH

## 2011-03-09 ENCOUNTER — Emergency Department (HOSPITAL_COMMUNITY)
Admission: EM | Admit: 2011-03-09 | Discharge: 2011-03-09 | Disposition: A | Discharge status: Home / Self Care | Payer: Self-pay | Attending: Emergency Medicine | Admitting: Emergency Medicine

## 2011-03-09 DIAGNOSIS — E7800 Pure hypercholesterolemia, unspecified: Secondary | ICD-10-CM | POA: Insufficient documentation

## 2011-03-09 DIAGNOSIS — I10 Essential (primary) hypertension: Secondary | ICD-10-CM | POA: Insufficient documentation

## 2011-03-09 DIAGNOSIS — R51 Headache: Secondary | ICD-10-CM | POA: Insufficient documentation

## 2011-03-09 LAB — BASIC METABOLIC PANEL
GFR calc non Af Amer: >60 mL/min (ref >60)
Glucose, Bld: 95 mg/dL (ref 70–99)
Potassium: 4.2 mEq/L (ref 3.5–5.1)
Sodium: 137 mEq/L (ref 135–145)

## 2011-03-09 LAB — TROPONIN I: Troponin I: <0.3 ng/mL (ref <0.30)

## 2011-03-09 LAB — DIFFERENTIAL
Basophils Absolute: 0 10*3/uL (ref 0.0–0.1)
Eosinophils Relative: 3 % (ref 0–5)
Lymphocytes Relative: 23 % (ref 12–46)
Neutro Abs: 5.2 10*3/uL (ref 1.7–7.7)

## 2011-03-09 LAB — CBC
HCT: 42.2 % (ref 36.0–46.0)
RDW: 14.3 % (ref 11.5–15.5)
WBC: 7.9 10*3/uL (ref 4.0–10.5)

## 2011-03-12 ENCOUNTER — Encounter: Payer: Self-pay | Admitting: Endocrinology

## 2011-03-12 NOTE — Letter (Signed)
July 26, 2008    To Whom It May Concern:   RE:  Danielle Patterson, Danielle Patterson  MRN:  [id number]  /  DOB:  [birth year]   Ms. Talton is an established patient with the Pulmonary Division of  [facility].  She has a history of severe airway reactions to  irritants and fumes and has done well since her last pulmonary  evaluation with work place restrictions, which have limited her exposure  to strong irritants and fumes, but especially to sulfa-containing  compounds.   Based on her response to this restriction, her prognosis is excellent,  and therefore, I strongly recommend she continue to avoid these  irritants as much as possible in her work place.    Sincerely,      Casimiro Needle B. Sherene Sires, MD, [facility]  Electronically Signed    MBW/MedQ  DD: 07/26/2008  DT: 07/26/2008  Job #: [id number]

## 2011-03-15 ENCOUNTER — Other Ambulatory Visit (INDEPENDENT_AMBULATORY_CARE_PROVIDER_SITE_OTHER): Payer: 59

## 2011-03-15 ENCOUNTER — Ambulatory Visit (INDEPENDENT_AMBULATORY_CARE_PROVIDER_SITE_OTHER): Payer: 59 | Admitting: Endocrinology

## 2011-03-15 ENCOUNTER — Ambulatory Visit (INDEPENDENT_AMBULATORY_CARE_PROVIDER_SITE_OTHER)
Admission: RE | Admit: 2011-03-15 | Discharge: 2011-03-15 | Disposition: A | Discharge status: Home / Self Care | Payer: 59 | Source: Ambulatory Visit | Attending: Endocrinology | Admitting: Endocrinology

## 2011-03-15 ENCOUNTER — Encounter: Payer: Self-pay | Admitting: Endocrinology

## 2011-03-15 VITALS — BP 136/88 | HR 79 | Temp 98.0°F | Resp 14 | Wt 168.0 lb

## 2011-03-15 DIAGNOSIS — R0789 Other chest pain: Secondary | ICD-10-CM

## 2011-03-15 DIAGNOSIS — R109 Unspecified abdominal pain: Secondary | ICD-10-CM

## 2011-03-15 DIAGNOSIS — R739 Hyperglycemia, unspecified: Secondary | ICD-10-CM

## 2011-03-15 DIAGNOSIS — R7309 Other abnormal glucose: Secondary | ICD-10-CM

## 2011-03-15 DIAGNOSIS — R071 Chest pain on breathing: Secondary | ICD-10-CM

## 2011-03-15 DIAGNOSIS — R10A2 Flank pain, left side: Secondary | ICD-10-CM

## 2011-03-15 LAB — URINALYSIS, ROUTINE W REFLEX MICROSCOPIC
Bilirubin Urine: NEGATIVE
Ketones, ur: NEGATIVE
Leukocytes, UA: NEGATIVE
Specific Gravity, Urine: 1.02 (ref 1.000–1.030)
Urobilinogen, UA: 0.2 (ref 0.0–1.0)

## 2011-03-15 LAB — HEMOGLOBIN A1C: Hgb A1c MFr Bld: 6.4 % (ref 4.6–6.5)

## 2011-03-15 MED ORDER — LOSARTAN POTASSIUM-HCTZ 100-25 MG PO TABS: 1.0000 | ORAL_TABLET | Freq: Every day | ORAL | Status: DC

## 2011-03-15 NOTE — Patient Instructions (Addendum)
Increase losartan-hctz to 100/25, 1 daily. Try applying non-prescription hydrocortisone and miconazole creams to the rash on your left foot.   Blood and urine tests, and a chest x-ray, are being ordered for you today.  please call [phone] to hear your test results.  You will be prompted to enter the 9-digit "MRN" number that appears at the top left of this page, followed by #.  Then you will hear the message. I hope you feel better soon.  If any of these symptoms persist into next week, please call your doctor. (update: i left message on phone-tree:  You should consider metformin).

## 2011-03-15 NOTE — Progress Notes (Signed)
Subjective:    Patient ID: Danielle Patterson, female    DOB: [birth year], 60 y.o.   MRN: [id number]  HPI The state of at least three ongoing medical problems is addressed today: Pt was noted in the er to have htn.   Headache is improved, but still happens intermittently.  The rash on her left foot has worsened again.  She reports intermittent pain of the left posterior chest wall, with twisting the torso. Past Medical History  Diagnosis Date  . HYPERCHOLESTEROLEMIA 10/24/2007  . HYPERTENSION 05/25/2007  . Acute pharyngitis 09/02/2009  . GERD 05/25/2007  . RENAL CYST, LEFT 03/11/2009  . 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 MYALGIA 04/01/2008  . Cramp of limb 04/01/2008  . Headache 03/13/2008  . Cough 07/25/2008  . CHEST PAIN, LEFT 06/24/2008  . ABDOMINAL PAIN, LOWER 10/21/2008  . ASYMPTOMATIC POSTMENOPAUSAL STATUS 10/24/2007  . Menopause   . Hyperglycemia   . Non-cardiogenic pulmonary edema     Past Surgical History  Procedure Date  . Abdominal hysterectomy   . Tubal ligation     History   Social History  . Marital Status: Single    Spouse Name: N/A    Number of Children: N/A  . Years of Education: N/A   Occupational History  . Not on file.   Social History Main Topics  . Smoking status: Former Smoker    Quit date: 10/25/1997  . Smokeless tobacco: Not on file  . Alcohol Use: No  . Drug Use: No  . Sexually Active:    Other Topics Concern  . Not on file   Social History Narrative  . No narrative on file    Current Outpatient Prescriptions on File Prior to Visit  Medication Sig Dispense Refill  . aspirin EC 81 MG tablet Take 81 mg by mouth daily.        . calcium carbonate 1250 MG capsule Take 1,250 mg by mouth daily.        Marland Kitchen loratadine (CLARITIN) 10 MG tablet Take 10 mg by mouth daily.        . mometasone (NASONEX) 50 MCG/ACT nasal spray 2 sprays by Nasal route every morning.        . ranitidine (ZANTAC) 150 MG tablet Take 150 mg by mouth at  bedtime.        . simvastatin (ZOCOR) 80 MG tablet Take 80 mg by mouth at bedtime.        . traMADol-acetaminophen (ULTRACET) 37.5-325 MG per tablet Take 1 tablet by mouth every 4 (four) hours as needed.          No Known Allergies  Family History  Problem Relation Age of Onset  . Breast cancer Mother   . Nephrolithiasis Mother   . Hypertension Father     BP 136/88  Pulse 79  Temp(Src) 98 F (36.7 C) (Oral)  Resp 14  Wt 168 lb (76.204 kg)  SpO2 98%   Review of Systems Denies loc and sob.      Objective:   Physical Exam GENERAL: no distress Ext: no edema Left foot: there is a mild eczematous rash on the medial aspect.   Left flank:  Nontender.  LUNGS:  Clear to auscultation    Lab Results  Component Value Date   WBC 7.9 03/09/2011   HGB 13.8 03/09/2011   HCT 42.2 03/09/2011   PLT 202 03/09/2011   CHOL 159 07/03/2010   TRIG 58.0 07/03/2010   HDL 70.30 07/03/2010  LDLDIRECT 160.0 02/06/2009   ALT 17 07/03/2010   AST 18 07/03/2010   NA 137 03/09/2011   K 4.2 MODERATE HEMOLYSIS 03/09/2011   CL 99 03/09/2011   CREATININE 0.54 03/09/2011   BUN 19 03/09/2011   CO2 26 03/09/2011   TSH 0.66 07/03/2010   HGBA1C 6.4 03/15/2011      Assessment & Plan:  Htn, well-controlled, but with situational component. Chest-wall pain, uncertain etiology Foot rash, recurrent Hyperglycemia, worse.

## 2011-03-16 ENCOUNTER — Telehealth: Payer: Self-pay

## 2011-03-16 MED ORDER — METFORMIN HCL ER 500 MG PO TB24: 500.0000 mg | ORAL_TABLET | Freq: Every day | ORAL | Status: DC

## 2011-03-16 NOTE — Telephone Encounter (Signed)
sent 

## 2011-03-16 NOTE — Telephone Encounter (Signed)
Pt called back stating she is willing to start Metformin and would like Rx sent to [facility]

## 2011-05-31 ENCOUNTER — Other Ambulatory Visit (INDEPENDENT_AMBULATORY_CARE_PROVIDER_SITE_OTHER): Payer: Self-pay

## 2011-05-31 ENCOUNTER — Encounter: Payer: Self-pay | Admitting: Endocrinology

## 2011-05-31 ENCOUNTER — Ambulatory Visit (INDEPENDENT_AMBULATORY_CARE_PROVIDER_SITE_OTHER): Payer: Self-pay | Admitting: Endocrinology

## 2011-05-31 VITALS — BP 128/84 | HR 81 | Temp 98.1°F | Ht 62.5 in | Wt 159.0 lb

## 2011-05-31 DIAGNOSIS — K625 Hemorrhage of anus and rectum: Secondary | ICD-10-CM

## 2011-05-31 LAB — CBC WITH DIFFERENTIAL/PLATELET
Basophils Absolute: 0 10*3/uL (ref 0.0–0.1)
HCT: 37.8 % (ref 36.0–46.0)
Lymphs Abs: 1.7 10*3/uL (ref 0.7–4.0)
MCHC: 32.5 g/dL (ref 30.0–36.0)
MCV: 79.2 fl (ref 78.0–100.0)
Monocytes Absolute: 0.7 10*3/uL (ref 0.1–1.0)
Platelets: 218 10*3/uL (ref 150.0–400.0)
RDW: 14.9 % — ABNORMAL HIGH (ref 11.5–14.6)

## 2011-05-31 NOTE — Progress Notes (Signed)
  Subjective:    Patient ID: Danielle Patterson, female    DOB: February 03, 1951, 60 y.o.   MRN: [id number]  HPI Pt states 2 weeks of slight epigastric pain, and slight assoc brbpr.   Past Medical History  Diagnosis Date  . HYPERCHOLESTEROLEMIA 10/24/2007  . HYPERTENSION 05/25/2007  . Acute pharyngitis 09/02/2009  . GERD 05/25/2007  . RENAL CYST, LEFT 03/11/2009  . 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 MYALGIA 04/01/2008  . Cramp of limb 04/01/2008  . Headache 03/13/2008  . Cough 07/25/2008  . CHEST PAIN, LEFT 06/24/2008  . ABDOMINAL PAIN, LOWER 10/21/2008  . ASYMPTOMATIC POSTMENOPAUSAL STATUS 10/24/2007  . Menopause   . Hyperglycemia   . Non-cardiogenic pulmonary edema     Past Surgical History  Procedure Date  . Abdominal hysterectomy   . Tubal ligation     History   Social History  . Marital Status: Single    Spouse Name: N/A    Number of Children: N/A  . Years of Education: N/A   Occupational History  . Not on file.   Social History Main Topics  . Smoking status: Former Smoker    Quit date: 10/25/1997  . Smokeless tobacco: Not on file  . Alcohol Use: No  . Drug Use: No  . Sexually Active:    Other Topics Concern  . Not on file   Social History Narrative  . No narrative on file    Current Outpatient Prescriptions on File Prior to Visit  Medication Sig Dispense Refill  . aspirin EC 81 MG tablet Take 81 mg by mouth daily.        . calcium carbonate 1250 MG capsule Take 1,250 mg by mouth daily.        Marland Kitchen loratadine (CLARITIN) 10 MG tablet Take 10 mg by mouth daily.        Marland Kitchen losartan-hydrochlorothiazide (HYZAAR) 100-25 MG per tablet Take 1 tablet by mouth daily.  30 tablet  11  . metFORMIN (GLUCOPHAGE-XR) 500 MG 24 hr tablet Take 1 tablet (500 mg total) by mouth daily with breakfast.  30 tablet  11  . mometasone (NASONEX) 50 MCG/ACT nasal spray 2 sprays by Nasal route every morning.        . ranitidine (ZANTAC) 150 MG tablet Take 150 mg by mouth at  bedtime.        . simvastatin (ZOCOR) 80 MG tablet Take 80 mg by mouth at bedtime.        . traMADol-acetaminophen (ULTRACET) 37.5-325 MG per tablet Take 1 tablet by mouth every 4 (four) hours as needed.          No Known Allergies  Family History  Problem Relation Age of Onset  . Breast cancer Mother   . Nephrolithiasis Mother   . Hypertension Father    BP 128/84  Pulse 81  Temp(Src) 98.1 F (36.7 C) (Oral)  Ht 5' 2.5" (1.588 m)  Wt 159 lb (72.122 kg)  BMI 28.62 kg/m2  SpO2 98%  Review of Systems She has lost a few lbs, due to her efforts.  Denies dysphagia.      Objective:   Physical Exam GENERAL: no distress ABDOMEN:  abdomen is soft, nontender.  no hepatosplenomegaly.   not distended.  no hernia    Assessment & Plan:  Htn, improved control Gi sxs, uncertain etiology, new

## 2011-05-31 NOTE — Patient Instructions (Addendum)
blood tests are being ordered for you today.  please call [phone] to hear your test results.  You will be prompted to enter the 9-digit "MRN" number that appears at the top left of this page, followed by #.  Then you will hear the message. Refer for a colonoscopy.  you will receive a phone call, with a day and time for an appointment. Try taking store-brand "omeprazole" 20 mg daily, until the colonoscopy.

## 2011-07-27 ENCOUNTER — Ambulatory Visit: Payer: Self-pay | Admitting: Endocrinology

## 2011-08-09 LAB — CBC
MCHC: 33.1
Platelets: 236
RBC: 5.23 — ABNORMAL HIGH
WBC: 7

## 2011-08-09 LAB — BASIC METABOLIC PANEL
BUN: 13
Calcium: 9.8
Creatinine, Ser: 0.78
GFR calc Af Amer: >60

## 2011-08-09 LAB — SEDIMENTATION RATE: Sed Rate: 3

## 2011-08-17 ENCOUNTER — Other Ambulatory Visit: Payer: Self-pay | Admitting: Endocrinology

## 2011-10-27 ENCOUNTER — Ambulatory Visit (INDEPENDENT_AMBULATORY_CARE_PROVIDER_SITE_OTHER): Payer: Self-pay | Admitting: Internal Medicine

## 2011-10-27 ENCOUNTER — Encounter: Payer: Self-pay | Admitting: Internal Medicine

## 2011-10-27 VITALS — BP 130/72 | HR 78 | Temp 98.4°F | Wt 154.8 lb

## 2011-10-27 DIAGNOSIS — J321 Chronic frontal sinusitis: Secondary | ICD-10-CM

## 2011-10-27 MED ORDER — TRAMADOL-ACETAMINOPHEN 37.5-325 MG PO TABS: 1.0000 | ORAL_TABLET | ORAL | Status: DC | PRN

## 2011-10-27 MED ORDER — AZITHROMYCIN 250 MG PO TABS: ORAL_TABLET | ORAL | Status: AC

## 2011-10-27 NOTE — Progress Notes (Signed)
  Subjective:    HPI  complains of head cold symptoms  Onset >1 week ago, wax/wane symptoms  associated with rhinorrhea, sneezing, sore throat, mild headache and low grade fever Also myalgias, sinus pressure and mild chest congestion No relief with OTC meds Precipitated by sick contacts  Past Medical History  Diagnosis Date  . HYPERCHOLESTEROLEMIA   . HYPERTENSION   . GERD   . RENAL CYST, LEFT   . SPONDYLOSIS UNSPEC SITE W/O MENTION MYELOPATHY   . Menopause   . Hyperglycemia   . Non-cardiogenic pulmonary edema     Review of Systems Constitutional: No fever or night sweats, no unexpected weight change Pulmonary: No pleurisy or hemoptysis Cardiovascular: No chest pain or palpitations     Objective:   Physical Exam BP 130/72  Pulse 78  Temp(Src) 98.4 F (36.9 C) (Oral)  Wt 154 lb 12.8 oz (70.217 kg)  SpO2 98% GEN: mildly ill appearing and audible head congestion HENT: NCAT, mild frontal sinus tenderness bilaterally, nares with clear discharge, oropharynx mild erythema, no exudate Eyes: Vision grossly intact, no conjunctivitis Lungs: Clear to auscultation without rhonchi or wheeze, no increased work of breathing Cardiovascular: Regular rate and rhythm, no bilateral edema      Assessment & Plan:  Frontal sinusitis  Cough, postnasal drip related to above    Empiric antibiotics prescribed due to symptom duration greater than 7 days Prescription pain med refill for headache symptoms - new prescriptions done Symptomatic care with Tylenol or Advil, hydration and rest -  salt gargle advised as needed

## 2011-10-27 NOTE — Patient Instructions (Signed)
It was good to see you today. Zpak antibiotics for sinus infection symptoms - also refill on tramadol for headache pain symptoms - Your prescription(s) have been submitted to your pharmacy. Please take as directed and contact our office if you believe you are having problem(s) with the medication(s). Stay hydrated, rest and call if symptoms worse or unimproved

## 2011-11-29 ENCOUNTER — Ambulatory Visit (INDEPENDENT_AMBULATORY_CARE_PROVIDER_SITE_OTHER): Payer: Self-pay | Admitting: *Deleted

## 2011-11-29 DIAGNOSIS — Z111 Encounter for screening for respiratory tuberculosis: Secondary | ICD-10-CM

## 2011-12-01 ENCOUNTER — Encounter: Payer: Self-pay | Admitting: *Deleted

## 2011-12-01 LAB — TB SKIN TEST: TB Skin Test: NEGATIVE mm

## 2011-12-22 ENCOUNTER — Ambulatory Visit (INDEPENDENT_AMBULATORY_CARE_PROVIDER_SITE_OTHER): Payer: Self-pay | Admitting: Internal Medicine

## 2011-12-22 ENCOUNTER — Encounter: Payer: Self-pay | Admitting: Internal Medicine

## 2011-12-22 VITALS — BP 130/92 | HR 85 | Temp 97.0°F | Ht 62.0 in | Wt 158.8 lb

## 2011-12-22 DIAGNOSIS — J309 Allergic rhinitis, unspecified: Secondary | ICD-10-CM

## 2011-12-22 DIAGNOSIS — J019 Acute sinusitis, unspecified: Secondary | ICD-10-CM

## 2011-12-22 MED ORDER — AZITHROMYCIN 250 MG PO TABS: ORAL_TABLET | ORAL | Status: AC

## 2011-12-22 MED ORDER — GLUCOSE BLOOD VI STRP: ORAL_STRIP | Status: DC

## 2011-12-22 MED ORDER — FLUTICASONE PROPIONATE 50 MCG/ACT NA SUSP: 2.0000 | Freq: Every day | NASAL | Status: DC

## 2011-12-22 MED ORDER — ONETOUCH LANCETS MISC: Status: DC

## 2011-12-22 NOTE — Progress Notes (Signed)
  Subjective:    Patient ID: Danielle Patterson, female    DOB: 1951-06-10, 61 y.o.   MRN: [id number]  HPI Recurrent sinus pressure - frontal>maxillary No fever but thick nasal discharge Chronic allergy symptoms  Past Medical History  Diagnosis Date  . HYPERCHOLESTEROLEMIA   . HYPERTENSION   . GERD   . RENAL CYST, LEFT   . SPONDYLOSIS UNSPEC SITE W/O MENTION MYELOPATHY   . Menopause   . Hyperglycemia   . Non-cardiogenic pulmonary edema     Review of Systems  Constitutional: Negative for fever and fatigue.  HENT: Negative for facial swelling and neck pain.   Respiratory: Negative for cough and shortness of breath.   Neurological: Negative for dizziness and headaches.       Objective:   Physical Exam BP 130/92  Pulse 85  Temp(Src) 97 F (36.1 C) (Oral)  Ht 5\' 2"  (1.575 m)  Wt 158 lb 12.8 oz (72.031 kg)  BMI 29.04 kg/m2  SpO2 99% Wt Readings from Last 3 Encounters:  12/22/11 158 lb 12.8 oz (72.031 kg)  10/27/11 154 lb 12.8 oz (70.217 kg)  05/31/11 159 lb (72.122 kg)   Constitutional: She appears well-developed and well-nourished. No distress. G-son at side HENT: Head: Normocephalic and atraumatic, mild sinus tenderness. Ears: B TMs ok, no erythema or effusion; Nose: Nose normal.  Mouth/Throat: Oropharynx is clear and moist. No oropharyngeal exudate.  Eyes: Conjunctivae and EOM are normal. Pupils are equal, round, and reactive to light. No scleral icterus.  Neck: Normal range of motion. Neck supple. No JVD present. No thyromegaly present.  Cardiovascular: Normal rate, regular rhythm and normal heart sounds.  No murmur heard. No BLE edema. Pulmonary/Chest: Effort normal and breath sounds normal. No respiratory distress. She has no wheezes.  Lab Results  Component Value Date   HGBA1C 6.4 03/15/2011      Assessment & Plan:  Sinusitis - acute on chronic - Azithromycin, nasal steroid

## 2011-12-22 NOTE — Patient Instructions (Signed)
It was good to see you today. Zpak antibiotics for sinus infection symptoms - also Flonase nose spray for allergy and sinus symptoms - tramadol for headache pain symptoms - Your prescription(s) have been submitted to your pharmacy. Please take as directed and contact our office if you believe you are having problem(s) with the medication(s). Stay hydrated, rest and call if symptoms worse or unimproved

## 2012-01-05 ENCOUNTER — Encounter: Payer: Self-pay | Admitting: Internal Medicine

## 2012-04-12 ENCOUNTER — Other Ambulatory Visit: Payer: Self-pay | Admitting: Endocrinology

## 2012-04-14 ENCOUNTER — Other Ambulatory Visit: Payer: Self-pay | Admitting: *Deleted

## 2012-04-14 MED ORDER — LOSARTAN POTASSIUM-HCTZ 100-25 MG PO TABS: 1.0000 | ORAL_TABLET | Freq: Every day | ORAL | Status: DC

## 2012-04-14 NOTE — Telephone Encounter (Signed)
R'cd fax from [facility] for refill of Losartan/HCTZ

## 2012-04-24 ENCOUNTER — Other Ambulatory Visit: Payer: Self-pay | Admitting: Endocrinology

## 2012-04-26 ENCOUNTER — Other Ambulatory Visit: Payer: Self-pay | Admitting: *Deleted

## 2012-04-26 MED ORDER — LOSARTAN POTASSIUM-HCTZ 100-25 MG PO TABS: 1.0000 | ORAL_TABLET | Freq: Every day | ORAL | Status: DC

## 2012-04-26 NOTE — Telephone Encounter (Signed)
R'cd fax from [facility] for refill of Losartan-HCTZ.

## 2012-06-27 ENCOUNTER — Emergency Department (HOSPITAL_COMMUNITY)
Admission: EM | Admit: 2012-06-27 | Discharge: 2012-06-27 | Disposition: A | Discharge status: Home / Self Care | Payer: Self-pay | Attending: Emergency Medicine | Admitting: Emergency Medicine

## 2012-06-27 ENCOUNTER — Encounter (HOSPITAL_COMMUNITY): Payer: Self-pay | Admitting: Family Medicine

## 2012-06-27 DIAGNOSIS — T148XXA Other injury of unspecified body region, initial encounter: Secondary | ICD-10-CM

## 2012-06-27 DIAGNOSIS — I10 Essential (primary) hypertension: Secondary | ICD-10-CM | POA: Insufficient documentation

## 2012-06-27 DIAGNOSIS — X58XXXA Exposure to other specified factors, initial encounter: Secondary | ICD-10-CM | POA: Insufficient documentation

## 2012-06-27 DIAGNOSIS — K219 Gastro-esophageal reflux disease without esophagitis: Secondary | ICD-10-CM | POA: Insufficient documentation

## 2012-06-27 DIAGNOSIS — E7800 Pure hypercholesterolemia, unspecified: Secondary | ICD-10-CM | POA: Insufficient documentation

## 2012-06-27 DIAGNOSIS — M549 Dorsalgia, unspecified: Secondary | ICD-10-CM

## 2012-06-27 DIAGNOSIS — Z87891 Personal history of nicotine dependence: Secondary | ICD-10-CM | POA: Insufficient documentation

## 2012-06-27 DIAGNOSIS — IMO0002 Reserved for concepts with insufficient information to code with codable children: Secondary | ICD-10-CM | POA: Insufficient documentation

## 2012-06-27 LAB — COMPREHENSIVE METABOLIC PANEL WITH GFR
ALT: 17 U/L (ref 0–35)
AST: 16 U/L (ref 0–37)
Albumin: 4.7 g/dL (ref 3.5–5.2)
Alkaline Phosphatase: 107 U/L (ref 39–117)
BUN: 18 mg/dL (ref 6–23)
CO2: 29 meq/L (ref 19–32)
Calcium: 10.1 mg/dL (ref 8.4–10.5)
Chloride: 101 meq/L (ref 96–112)
Creatinine, Ser: 0.62 mg/dL (ref 0.50–1.10)
GFR calc Af Amer: >90 mL/min
GFR calc non Af Amer: >90 mL/min
Glucose, Bld: 87 mg/dL (ref 70–99)
Potassium: 4.2 meq/L (ref 3.5–5.1)
Sodium: 140 meq/L (ref 135–145)
Total Bilirubin: 0.7 mg/dL (ref 0.3–1.2)
Total Protein: 8.2 g/dL (ref 6.0–8.3)

## 2012-06-27 LAB — CBC
HCT: 42.9 % (ref 36.0–46.0)
MCV: 79.6 fL (ref 78.0–100.0)
Platelets: 213 10*3/uL (ref 150–400)
RBC: 5.39 MIL/uL — ABNORMAL HIGH (ref 3.87–5.11)
RDW: 14.3 % (ref 11.5–15.5)
WBC: 8.9 10*3/uL (ref 4.0–10.5)

## 2012-06-27 LAB — URINALYSIS, ROUTINE W REFLEX MICROSCOPIC
Bilirubin Urine: NEGATIVE
Glucose, UA: NEGATIVE mg/dL
Hgb urine dipstick: NEGATIVE
Ketones, ur: NEGATIVE mg/dL
Specific Gravity, Urine: 1.032 — ABNORMAL HIGH (ref 1.005–1.030)
pH: 5 (ref 5.0–8.0)

## 2012-06-27 MED ORDER — NAPROXEN 500 MG PO TABS: 500.0000 mg | ORAL_TABLET | Freq: Two times a day (BID) | ORAL | Status: DC

## 2012-06-27 MED ORDER — ORPHENADRINE CITRATE ER 100 MG PO TB12: 100.0000 mg | ORAL_TABLET | Freq: Two times a day (BID) | ORAL | Status: AC

## 2012-06-27 MED ORDER — OXYCODONE-ACETAMINOPHEN 5-325 MG PO TABS: 1.0000 | ORAL_TABLET | Freq: Four times a day (QID) | ORAL | Status: AC | PRN

## 2012-06-27 MED ORDER — KETOROLAC TROMETHAMINE 30 MG/ML IJ SOLN
30.0000 mg | Freq: Once | INTRAMUSCULAR | Status: AC
  Administered 2012-06-27: 30 mg via INTRAVENOUS
  Filled 2012-06-27: qty 1

## 2012-06-27 NOTE — ED Provider Notes (Signed)
History     CSN: [id number]  Arrival date & time 06/27/12  1529   First MD Initiated Contact with Patient 06/27/12 1738      Chief Complaint  Patient presents with  . Flank Pain    (Consider location/radiation/quality/duration/timing/severity/associated sxs/prior treatment) HPI Comments: Danielle Patterson presents for evaluation of right-sided back and flank pain.  She first noted a dull discomfort 2 days ago.  It has increased slightly in intensity and persistence.  She denies any trauma, heavy lifting, repetitive exercise, dysuria, pyuria, fever, or abdominal pain.  She has noted her urine to appear more concentrated than usual.  She denies any previous episodes of similar discomfort and any history of kidney stones.  Patient is a 61 y.o. female presenting with flank pain. The history is provided by the patient. No language interpreter was used.  Flank Pain This is a new problem. The current episode started 2 days ago. The problem has not changed since onset.Pertinent negatives include no chest pain, no abdominal pain, no headaches and no shortness of breath. The symptoms are aggravated by walking, bending, twisting and standing. The symptoms are relieved by rest and lying down. She has tried acetaminophen for the symptoms. The treatment provided no relief.    Past Medical History  Diagnosis Date  . HYPERCHOLESTEROLEMIA   . HYPERTENSION   . GERD   . RENAL CYST, LEFT   . SPONDYLOSIS UNSPEC SITE W/O MENTION MYELOPATHY   . Menopause   . Hyperglycemia   . Non-cardiogenic pulmonary edema     Past Surgical History  Procedure Date  . Abdominal hysterectomy   . Tubal ligation     Family History  Problem Relation Age of Onset  . Breast cancer Mother   . Nephrolithiasis Mother   . Hypertension Father     History  Substance Use Topics  . Smoking status: Former Smoker    Quit date: 10/25/1997  . Smokeless tobacco: Not on file  . Alcohol Use: No    OB History    Grav Para Term  Preterm Abortions TAB SAB Ect Mult Living                  Review of Systems  Constitutional: Negative for chills, diaphoresis, activity change, appetite change and fatigue.  HENT: Negative for congestion, sore throat, rhinorrhea, trouble swallowing, neck pain and neck stiffness.   Eyes: Negative.   Respiratory: Negative for cough, chest tightness and shortness of breath.   Cardiovascular: Negative for chest pain, palpitations and leg swelling.  Gastrointestinal: Negative for nausea, vomiting, abdominal pain, diarrhea, constipation, blood in stool and abdominal distention.  Genitourinary: Positive for flank pain. Negative for dysuria, urgency, frequency, hematuria, decreased urine volume, difficulty urinating, pelvic pain and dyspareunia.  Musculoskeletal: Positive for back pain and arthralgias. Negative for myalgias, joint swelling and gait problem.  Skin: Negative for color change, pallor, rash and wound.  Neurological: Negative for dizziness, tremors, syncope, facial asymmetry, weakness, light-headedness and headaches.  Psychiatric/Behavioral: Negative.     Allergies  Review of patient's allergies indicates no known allergies.  Home Medications   Current Outpatient Rx  Name Route Sig Dispense Refill  . ASPIRIN EC 81 MG PO TBEC Oral Take 81 mg by mouth daily.     Marland Kitchen CALCIUM CARBONATE 1250 MG PO CAPS Oral Take 1,250 mg by mouth daily.      Marland Kitchen FLUTICASONE PROPIONATE 50 MCG/ACT NA SUSP Nasal Place 2 sprays into the nose daily.    Marland Kitchen GLUCOSE BLOOD VI  STRP  Use 1 every morning check blood sugars    . IBUPROFEN 200 MG PO TABS Oral Take 400 mg by mouth every 6 (six) hours as needed. Pain    . LOSARTAN POTASSIUM-HCTZ 100-25 MG PO TABS Oral Take 1 tablet by mouth daily.    Marland Kitchen METFORMIN HCL ER 500 MG PO TB24 Oral Take 500 mg by mouth daily with breakfast.    . ONETOUCH LANCETS MISC  Use as directed    . SIMVASTATIN 80 MG PO TABS Oral Take 80 mg by mouth at bedtime.      BP 121/92  Pulse 86   Temp 98.6 F (37 C) (Oral)  Resp 18  SpO2 97%  Physical Exam  Nursing note and vitals reviewed. Constitutional: She is oriented to person, place, and time. She appears well-developed and well-nourished. No distress.  HENT:  Head: Normocephalic and atraumatic.  Right Ear: External ear normal.  Left Ear: External ear normal.  Nose: Nose normal.  Mouth/Throat: Oropharynx is clear and moist. No oropharyngeal exudate.  Eyes: Conjunctivae and EOM are normal. Pupils are equal, round, and reactive to light. Right eye exhibits no discharge. Left eye exhibits no discharge. No scleral icterus.  Neck: Normal range of motion. Neck supple. No JVD present. No tracheal deviation present. No thyromegaly present.  Cardiovascular: Normal rate, regular rhythm, normal heart sounds and intact distal pulses.  Exam reveals no gallop and no friction rub.   No murmur heard. Pulmonary/Chest: Effort normal and breath sounds normal. No stridor. No respiratory distress. She has no wheezes. She has no rales. She exhibits no tenderness.  Abdominal: Soft. Bowel sounds are normal. She exhibits no shifting dullness, no distension, no pulsatile midline mass and no mass. There is no hepatosplenomegaly. There is tenderness in the left lower quadrant. There is no rebound, no guarding and no CVA tenderness. No hernia.  Musculoskeletal: Normal range of motion. She exhibits no edema.       Arms:      Mild right-sided lateral thoracic and lumbar tenderness.  No muscle spasm, no midline tenderness or step-offs.    Lymphadenopathy:    She has no cervical adenopathy.  Neurological: She is alert and oriented to person, place, and time. She has normal strength. She displays no tremor. No cranial nerve deficit or sensory deficit. She displays no seizure activity. Coordination normal. GCS eye subscore is 4. GCS verbal subscore is 5. GCS motor subscore is 6. She displays no Babinski's sign on the right side. She displays no Babinski's sign  on the left side.  Reflex Scores:      Patellar reflexes are 2+ on the right side and 2+ on the left side.      Neg straight leg raise bilat  Skin: Skin is warm and dry. No rash noted. She is not diaphoretic. No erythema. No pallor.  Psychiatric: She has a normal mood and affect. Her behavior is normal.    ED Course  Procedures (including critical care time)  Labs Reviewed  URINALYSIS, ROUTINE W REFLEX MICROSCOPIC - Abnormal; Notable for the following:    Specific Gravity, Urine 1.032 (*)     All other components within normal limits  CBC  COMPREHENSIVE METABOLIC PANEL   No results found.   No diagnosis found.    MDM  Pt presents for evaluation of right flank pain over the last 2 days.  She denies any other symptoms, trauma, inciting event, and states she otherwise feels well.  She appears comfortable, note stable  VS - NAD.  Urinalysis obtained in triage demonstrates no evidence of hematuria or UTI.  Will obtain basic belly labs as she also had very mild LLQ tenderness on exam.  Will treat pain and possibly obtain imaging if exam changes or significant lab abnormalities noted.  Flank pain is easily reproduced on exam suggesting musculoskeletal discomfort.   2050.  Pt stable, NAD.  Note nl CBC and CMP.  Pain still easily reporduced.  Will treat as a muscle strain.  Encouraged outpt f/u.     Tobin Chad, MD 06/27/12 2059

## 2012-06-27 NOTE — ED Notes (Signed)
MD at bedside. 

## 2012-06-27 NOTE — ED Notes (Signed)
Pt reports right flank pain x 2 days. Denies hx of kidney stones/uti.  Denies urinary problems, except reports foul odor to urine. Denies N/V. NAD noted at this time.

## 2012-09-03 ENCOUNTER — Other Ambulatory Visit: Payer: Self-pay | Admitting: Endocrinology

## 2012-10-22 ENCOUNTER — Other Ambulatory Visit: Payer: Self-pay | Admitting: Endocrinology

## 2012-10-26 ENCOUNTER — Telehealth: Payer: Self-pay

## 2012-10-26 NOTE — Telephone Encounter (Signed)
Pt called requesting rx refill of metformin, pt was advised she has not seen Dr. Sharee Holster since it 07/27/11 and needs to make follow-up appointment, pt states an understanding and made an appointment for 11/03/12.

## 2012-11-02 ENCOUNTER — Telehealth: Payer: Self-pay | Admitting: Endocrinology

## 2012-11-02 NOTE — Telephone Encounter (Signed)
The patient left voicemail stating she needs to speak with Dr. George Hugh nurse.  Please call the patient at [phone].

## 2012-11-03 ENCOUNTER — Ambulatory Visit (INDEPENDENT_AMBULATORY_CARE_PROVIDER_SITE_OTHER): Payer: Self-pay | Admitting: Endocrinology

## 2012-11-03 ENCOUNTER — Encounter: Payer: Self-pay | Admitting: Endocrinology

## 2012-11-03 VITALS — BP 120/70 | HR 79 | Temp 97.5°F | Wt 152.0 lb

## 2012-11-03 DIAGNOSIS — Z79899 Other long term (current) drug therapy: Secondary | ICD-10-CM

## 2012-11-03 DIAGNOSIS — I10 Essential (primary) hypertension: Secondary | ICD-10-CM

## 2012-11-03 DIAGNOSIS — R7309 Other abnormal glucose: Secondary | ICD-10-CM

## 2012-11-03 DIAGNOSIS — R739 Hyperglycemia, unspecified: Secondary | ICD-10-CM

## 2012-11-03 DIAGNOSIS — E7800 Pure hypercholesterolemia, unspecified: Secondary | ICD-10-CM

## 2012-11-03 LAB — CBC WITH DIFFERENTIAL/PLATELET
Basophils Absolute: 0 10*3/uL (ref 0.0–0.1)
Eosinophils Relative: 2 % (ref 0–5)
Lymphocytes Relative: 29 % (ref 12–46)
MCV: 78.3 fL (ref 78.0–100.0)
Neutrophils Relative %: 60 % (ref 43–77)
Platelets: 211 10*3/uL (ref 150–400)
RDW: 15.1 % (ref 11.5–15.5)
WBC: 6.8 10*3/uL (ref 4.0–10.5)

## 2012-11-03 LAB — HEPATIC FUNCTION PANEL
ALT: 11 U/L (ref 0–35)
AST: 14 U/L (ref 0–37)
Albumin: 4.9 g/dL (ref 3.5–5.2)
Bilirubin, Direct: 0.2 mg/dL (ref 0.0–0.3)
Total Protein: 7.4 g/dL (ref 6.0–8.3)

## 2012-11-03 LAB — LIPID PANEL
LDL Cholesterol: 93 mg/dL (ref 0–99)
Triglycerides: 67 mg/dL (ref <150)

## 2012-11-03 LAB — BASIC METABOLIC PANEL
BUN: 25 mg/dL — ABNORMAL HIGH (ref 6–23)
CO2: 32 mEq/L (ref 19–32)
Chloride: 106 mEq/L (ref 96–112)
Creat: 0.72 mg/dL (ref 0.50–1.10)
Glucose, Bld: 79 mg/dL (ref 70–99)
Potassium: 4 mEq/L (ref 3.5–5.3)

## 2012-11-03 MED ORDER — LOSARTAN POTASSIUM-HCTZ 100-25 MG PO TABS: 1.0000 | ORAL_TABLET | Freq: Every day | ORAL | Status: DC

## 2012-11-03 MED ORDER — SIMVASTATIN 80 MG PO TABS: 80.0000 mg | ORAL_TABLET | Freq: Every day | ORAL | Status: DC

## 2012-11-03 MED ORDER — METFORMIN HCL ER 500 MG PO TB24: 500.0000 mg | ORAL_TABLET | Freq: Every day | ORAL | Status: DC

## 2012-11-03 NOTE — Telephone Encounter (Signed)
Pt came in for appt today.  

## 2012-11-03 NOTE — Patient Instructions (Addendum)
blood tests are being requested for you today.  We'll contact you with results.  i have refilled your medications.   Please come in soon for a regular physical.

## 2012-11-03 NOTE — Progress Notes (Signed)
  Subjective:    Patient ID: Danielle Patterson, female    DOB: June 14, 1951, 62 y.o.   MRN: [id number]  HPI The state of at least three ongoing medical problems is addressed today, with interval history of each noted here: Dyslipidemia: she denies chest pain.  She does not take zocor HTN: she denies sob hyperglycemia: she dhe has lost a few lbs Past Medical History  Diagnosis Date  . HYPERCHOLESTEROLEMIA   . HYPERTENSION   . GERD   . RENAL CYST, LEFT   . SPONDYLOSIS UNSPEC SITE W/O MENTION MYELOPATHY   . Menopause   . Hyperglycemia   . Non-cardiogenic pulmonary edema     Past Surgical History  Procedure Date  . Abdominal hysterectomy   . Tubal ligation     History   Social History  . Marital Status: Single    Spouse Name: N/A    Number of Children: N/A  . Years of Education: N/A   Occupational History  . Not on file.   Social History Main Topics  . Smoking status: Former Smoker    Quit date: 10/25/1997  . Smokeless tobacco: Not on file  . Alcohol Use: No  . Drug Use: No  . Sexually Active:    Other Topics Concern  . Not on file   Social History Narrative  . No narrative on file    Current Outpatient Prescriptions on File Prior to Visit  Medication Sig Dispense Refill  . aspirin EC 81 MG tablet Take 81 mg by mouth daily.       . calcium carbonate 1250 MG capsule Take 1,250 mg by mouth daily.        . fluticasone (FLONASE) 50 MCG/ACT nasal spray Place 2 sprays into the nose daily.      Marland Kitchen glucose blood test strip Use 1 every morning check blood sugars      . ibuprofen (ADVIL,MOTRIN) 200 MG tablet Take 400 mg by mouth every 6 (six) hours as needed. Pain      . ONE TOUCH LANCETS MISC Use as directed        No Known Allergies  Family History  Problem Relation Age of Onset  . Breast cancer Mother   . Nephrolithiasis Mother   . Hypertension Father     BP 120/70  Pulse 79  Temp 97.5 F (36.4 C) (Oral)  Wt 152 lb (68.947 kg)  SpO2 95%    Review of  Systems Denies polyuria and nasal congestion    Objective:   Physical Exam VITAL SIGNS:  See vs page GENERAL: no distress LUNGS:  Clear to auscultation HEART:  Regular rate and rhythm without murmurs noted. Normal S1,S2.     Lab Results  Component Value Date   WBC 6.8 11/03/2012   HGB 12.8 11/03/2012   HCT 39.3 11/03/2012   PLT 211 11/03/2012   GLUCOSE 79 11/03/2012   CHOL 172 11/03/2012   TRIG 67 11/03/2012   HDL 66 11/03/2012   LDLDIRECT 160.0 02/06/2009   LDLCALC 93 11/03/2012   ALT 11 11/03/2012   AST 14 11/03/2012   NA 145 11/03/2012   K 4.0 11/03/2012   CL 106 11/03/2012   CREATININE 0.72 11/03/2012   BUN 25* 11/03/2012   CO2 32 11/03/2012   TSH 0.689 11/03/2012   HGBA1C 6.3* 11/03/2012      Assessment & Plan:  Type 2 DM: well-controlled Dyslipidemia: therapy limited by noncompliance.  i'll do the best i can. HTN, well-controlled

## 2012-11-04 LAB — URINALYSIS, ROUTINE W REFLEX MICROSCOPIC
Hgb urine dipstick: NEGATIVE
Leukocytes, UA: NEGATIVE
Nitrite: NEGATIVE
Specific Gravity, Urine: >1.03 — ABNORMAL HIGH (ref 1.005–1.030)
Urobilinogen, UA: 0.2 mg/dL (ref 0.0–1.0)

## 2012-12-19 ENCOUNTER — Ambulatory Visit (INDEPENDENT_AMBULATORY_CARE_PROVIDER_SITE_OTHER): Payer: BC Managed Care – PPO | Admitting: *Deleted

## 2012-12-19 ENCOUNTER — Ambulatory Visit: Payer: Self-pay

## 2012-12-19 DIAGNOSIS — Z111 Encounter for screening for respiratory tuberculosis: Secondary | ICD-10-CM

## 2012-12-19 DIAGNOSIS — Z23 Encounter for immunization: Secondary | ICD-10-CM

## 2012-12-22 ENCOUNTER — Encounter: Payer: Self-pay | Admitting: Internal Medicine

## 2013-01-26 ENCOUNTER — Encounter: Payer: Self-pay | Admitting: Internal Medicine

## 2013-02-07 ENCOUNTER — Encounter: Payer: Self-pay | Admitting: Internal Medicine

## 2013-03-02 ENCOUNTER — Ambulatory Visit: Payer: BC Managed Care – PPO | Admitting: Endocrinology

## 2013-03-02 ENCOUNTER — Emergency Department (HOSPITAL_COMMUNITY)
Admission: EM | Admit: 2013-03-02 | Discharge: 2013-03-02 | Disposition: A | Discharge status: Home / Self Care | Payer: BC Managed Care – PPO | Attending: Emergency Medicine | Admitting: Emergency Medicine

## 2013-03-02 ENCOUNTER — Encounter (HOSPITAL_COMMUNITY): Payer: Self-pay | Admitting: *Deleted

## 2013-03-02 DIAGNOSIS — I10 Essential (primary) hypertension: Secondary | ICD-10-CM | POA: Insufficient documentation

## 2013-03-02 DIAGNOSIS — J301 Allergic rhinitis due to pollen: Secondary | ICD-10-CM

## 2013-03-02 DIAGNOSIS — Z79899 Other long term (current) drug therapy: Secondary | ICD-10-CM | POA: Insufficient documentation

## 2013-03-02 DIAGNOSIS — R05 Cough: Secondary | ICD-10-CM

## 2013-03-02 DIAGNOSIS — E7800 Pure hypercholesterolemia, unspecified: Secondary | ICD-10-CM | POA: Insufficient documentation

## 2013-03-02 DIAGNOSIS — Z87891 Personal history of nicotine dependence: Secondary | ICD-10-CM | POA: Insufficient documentation

## 2013-03-02 DIAGNOSIS — K219 Gastro-esophageal reflux disease without esophagitis: Secondary | ICD-10-CM | POA: Insufficient documentation

## 2013-03-02 DIAGNOSIS — R059 Cough, unspecified: Secondary | ICD-10-CM | POA: Insufficient documentation

## 2013-03-02 DIAGNOSIS — J329 Chronic sinusitis, unspecified: Secondary | ICD-10-CM | POA: Insufficient documentation

## 2013-03-02 DIAGNOSIS — J3489 Other specified disorders of nose and nasal sinuses: Secondary | ICD-10-CM | POA: Insufficient documentation

## 2013-03-02 DIAGNOSIS — J309 Allergic rhinitis, unspecified: Secondary | ICD-10-CM

## 2013-03-02 DIAGNOSIS — Z87448 Personal history of other diseases of urinary system: Secondary | ICD-10-CM | POA: Insufficient documentation

## 2013-03-02 DIAGNOSIS — Z8739 Personal history of other diseases of the musculoskeletal system and connective tissue: Secondary | ICD-10-CM | POA: Insufficient documentation

## 2013-03-02 DIAGNOSIS — Z7982 Long term (current) use of aspirin: Secondary | ICD-10-CM | POA: Insufficient documentation

## 2013-03-02 LAB — GLUCOSE, CAPILLARY: Glucose-Capillary: 107 mg/dL — ABNORMAL HIGH (ref 70–99)

## 2013-03-02 MED ORDER — IPRATROPIUM BROMIDE 0.02 % IN SOLN
0.5000 mg | RESPIRATORY_TRACT | Status: DC
  Administered 2013-03-02: 0.5 mg via RESPIRATORY_TRACT
  Filled 2013-03-02: qty 2.5

## 2013-03-02 MED ORDER — BUDESONIDE 32 MCG/ACT NA SUSP: 1.0000 | Freq: Every day | NASAL | Status: DC

## 2013-03-02 MED ORDER — IBUPROFEN 800 MG PO TABS
800.0000 mg | ORAL_TABLET | Freq: Once | ORAL | Status: AC
  Administered 2013-03-02: 800 mg via ORAL
  Filled 2013-03-02: qty 1

## 2013-03-02 MED ORDER — LORATADINE 10 MG PO TABS
10.0000 mg | ORAL_TABLET | Freq: Once | ORAL | Status: AC
  Administered 2013-03-02: 10 mg via ORAL
  Filled 2013-03-02: qty 1

## 2013-03-02 MED ORDER — LORATADINE 10 MG PO TABS: 10.0000 mg | ORAL_TABLET | Freq: Every day | ORAL | Status: DC

## 2013-03-02 MED ORDER — ALBUTEROL SULFATE (5 MG/ML) 0.5% IN NEBU
2.5000 mg | INHALATION_SOLUTION | RESPIRATORY_TRACT | Status: DC
  Administered 2013-03-02: 2.5 mg via RESPIRATORY_TRACT
  Filled 2013-03-02: qty 0.5

## 2013-03-02 MED ORDER — BENZONATATE 100 MG PO CAPS
200.0000 mg | ORAL_CAPSULE | Freq: Three times a day (TID) | ORAL | Status: DC
Start: 2013-03-02 — End: 2013-03-21

## 2013-03-02 MED ORDER — OXYMETAZOLINE HCL 0.05 % NA SOLN
1.0000 | Freq: Once | NASAL | Status: AC
  Administered 2013-03-02: 1 via NASAL
  Filled 2013-03-02: qty 15

## 2013-03-02 NOTE — ED Provider Notes (Signed)
History     CSN: [id number]  Arrival date & time 03/02/13  0006   First MD Initiated Contact with Patient 03/02/13 0102      Chief Complaint  Patient presents with  . Headache   HPI Danielle Patterson is a 62 y.o. female presenting with a frontal throbbing headache is severe started at 5 PM and has gotten worse. She's had an associated cough, rhinorrhea, nasal congestion, denies fevers chills or file nasal drainage, she's also had some mild sore throat that is worse on swallowing. She's had mild nausea vomiting with the headache. She is concerned that she has a "sinus infection" was going to see her primary care physician tomorrow for antibiotics.   Past Medical History  Diagnosis Date  . HYPERCHOLESTEROLEMIA   . HYPERTENSION   . GERD   . RENAL CYST, LEFT   . SPONDYLOSIS UNSPEC SITE W/O MENTION MYELOPATHY   . Menopause   . Hyperglycemia   . Non-cardiogenic pulmonary edema     Past Surgical History  Procedure Laterality Date  . Abdominal hysterectomy    . Tubal ligation      Family History  Problem Relation Age of Onset  . Breast cancer Mother   . Nephrolithiasis Mother   . Hypertension Father     History  Substance Use Topics  . Smoking status: Former Smoker    Quit date: 10/25/1997  . Smokeless tobacco: Not on file  . Alcohol Use: No    OB History   Grav Para Term Preterm Abortions TAB SAB Ect Mult Living                  Review of Systems At least 10pt or greater review of systems completed and are negative except where specified in the HPI.  Allergies  Review of patient's allergies indicates no known allergies.  Home Medications   Current Outpatient Rx  Name  Route  Sig  Dispense  Refill  . aspirin EC 81 MG tablet   Oral   Take 81 mg by mouth daily.          . calcium carbonate 1250 MG capsule   Oral   Take 1,250 mg by mouth daily.           Marland Kitchen ibuprofen (ADVIL,MOTRIN) 200 MG tablet   Oral   Take 400 mg by mouth every 6 (six) hours as  needed. Pain         . losartan-hydrochlorothiazide (HYZAAR) 100-25 MG per tablet   Oral   Take 1 tablet by mouth daily.   30 tablet   11   . metFORMIN (GLUCOPHAGE-XR) 500 MG 24 hr tablet   Oral   Take 1 tablet (500 mg total) by mouth daily with breakfast.   30 tablet   11   . simvastatin (ZOCOR) 80 MG tablet   Oral   Take 1 tablet (80 mg total) by mouth at bedtime.   30 tablet   11   . benzonatate (TESSALON) 100 MG capsule   Oral   Take 2 capsules (200 mg total) by mouth every 8 (eight) hours.   21 capsule   0   . budesonide (RHINOCORT AQUA) 32 MCG/ACT nasal spray   Nasal   Place 1 spray into the nose daily.   1 Bottle   0   . loratadine (CLARITIN) 10 MG tablet   Oral   Take 1 tablet (10 mg total) by mouth daily.   30 tablet   0  BP 168/102  Pulse 72  Temp(Src) 98.3 F (36.8 C) (Oral)  Resp 18  Wt 155 lb 2 oz (70.364 kg)  BMI 28.37 kg/m2  SpO2 100%  Physical Exam  Nursing notes reviewed.  Electronic medical record reviewed. VITAL SIGNS:   Filed Vitals:   03/02/13 0039 03/02/13 0140 03/02/13 0352  BP: 197/110  168/102  Pulse: 82  72  Temp: 98.3 F (36.8 C)    TempSrc: Oral    Resp: 18  18  Weight: 155 lb 2 oz (70.364 kg)    SpO2: 97% 99% 100%   CONSTITUTIONAL: Awake, oriented, appears non-toxic HENT: Atraumatic, normocephalic, oral mucosa pink and moist, airway patent. Throat is mildly erythematous with cobblestoning of the pharynx. Nares patent with clear drainage, turbinates are boggy and mildly erythematous. External ears normal, TMs are clear bilaterally. EYES: Conjunctiva clear, EOMI, PERRLA NECK: Trachea midline, non-tender, supple CARDIOVASCULAR: Normal heart rate, Normal rhythm, No murmurs, rubs, gallops PULMONARY/CHEST: A few coarse transmitted upper airway noises, clear to auscultation, no rhonchi, wheezes, or rales. Symmetrical breath sounds. Non-tender. ABDOMINAL: Non-distended, soft, non-tender - no rebound or guarding.  BS  normal. NEUROLOGIC: Non-focal, moving all four extremities, no gross sensory or motor deficits. EXTREMITIES: No clubbing, cyanosis, or edema SKIN: Warm, Dry, No erythema, No rash  ED Course  Procedures (including critical care time)  Labs Reviewed  GLUCOSE, CAPILLARY - Abnormal; Notable for the following:    Glucose-Capillary 107 (*)    All other components within normal limits   No results found.   1. Hay fever   2. Allergic rhinitis   3. Sinus headache   4. Cough       MDM  Danielle Patterson is a 62 y.o. female presenting with headache and other URI/allergic type symptoms. Patient's headache was almost completely relieved with ibuprofen that she's not tried anything for it yet.  We'll treat the patient symptomatically with some nasal spray, Claritin and ibuprofen for further headaches. As well as Tessalon for cough. Do not think she has got a pneumonia, she is afebrile, do not hear any extra breath sounds & she is nontoxic.  Likewise I do not think she's having a migraine headache, but do not think she's got central nervous infection including encephalitis or meningitis, do not think there is any infection of the mid face, including sinusitis.  I spent approximately 10 minutes at the bedside educating the patient on antibiotic nonuse.  I explained the diagnosis and have given explicit precautions to return to the ER including worsening headache, fever, foul smelling nasal drainage or any other new or worsening symptoms. The patient understands and accepts the medical plan as it's been dictated and I have answered their questions. Discharge instructions concerning home care and prescriptions have been given.  The patient is STABLE and is discharged to home in good condition.          Jones Skene, MD 03/02/13 [phone]

## 2013-03-02 NOTE — ED Notes (Signed)
Pt states that she began to have a headache that began around 5pm; pt states that the pain has progressively gotten worse; pt also c/o N/V with headache; pt also reports that she has had sinus congestion and drainage x 3 days; pt also present to the ER with hypertension, pt reports that she has a hx of HTN and has been taking her medications as prescribed.

## 2013-03-02 NOTE — ED Notes (Signed)
Pt states normal b/p and takes meds.

## 2013-03-21 ENCOUNTER — Ambulatory Visit (AMBULATORY_SURGERY_CENTER): Payer: BC Managed Care – PPO | Admitting: *Deleted

## 2013-03-21 VITALS — Ht 62.0 in | Wt 153.0 lb

## 2013-03-21 DIAGNOSIS — Z1211 Encounter for screening for malignant neoplasm of colon: Secondary | ICD-10-CM

## 2013-03-21 MED ORDER — MOVIPREP 100 G PO SOLR: ORAL | Status: DC

## 2013-03-22 ENCOUNTER — Encounter: Payer: Self-pay | Admitting: Internal Medicine

## 2013-04-04 ENCOUNTER — Ambulatory Visit (AMBULATORY_SURGERY_CENTER): Payer: BC Managed Care – PPO | Admitting: Internal Medicine

## 2013-04-04 ENCOUNTER — Encounter: Payer: Self-pay | Admitting: Internal Medicine

## 2013-04-04 VITALS — BP 123/79 | HR 66 | Temp 96.9°F | Resp 19 | Ht 62.0 in | Wt 153.0 lb

## 2013-04-04 DIAGNOSIS — K649 Unspecified hemorrhoids: Secondary | ICD-10-CM

## 2013-04-04 DIAGNOSIS — Z1211 Encounter for screening for malignant neoplasm of colon: Secondary | ICD-10-CM

## 2013-04-04 LAB — GLUCOSE, CAPILLARY: Glucose-Capillary: 86 mg/dL (ref 70–99)

## 2013-04-04 MED ORDER — SODIUM CHLORIDE 0.9 % IV SOLN: 500.0000 mL | INTRAVENOUS | Status: DC

## 2013-04-04 MED ORDER — HYDROCORTISONE ACETATE 25 MG RE SUPP: 25.0000 mg | Freq: Two times a day (BID) | RECTAL | Status: DC | PRN

## 2013-04-04 NOTE — Op Note (Signed)
[facility] [address] [place], [address]   COLONOSCOPY PROCEDURE REPORT  PATIENT: Danielle Patterson, Danielle Patterson.  MR#: [id number] BIRTHDATE: 10/23/1951 , 61  yrs. old GENDER: Female ENDOSCOPIST: Hart Carwin, MD REFERRED BY:  Minus Breeding, M.D. PROCEDURE DATE:  04/04/2013 PROCEDURE:   Colonoscopy, screening ASA CLASS:   Class I INDICATIONS:Average risk patient for colon cancer and last colonoscopy 2003 was normal except for hemorrhoids. MEDICATIONS: MAC sedation, administered by CRNA and propofol (Diprivan) 250mg  IV  DESCRIPTION OF PROCEDURE:   After the risks and benefits and of the procedure were explained, informed consent was obtained.  A digital rectal exam revealed no abnormalities of the rectum.    The LB [id number]  endoscope was introduced through the anus and advanced to the cecum, which was identified by both the appendix and ileocecal valve .  The quality of the prep was good, using MoviPrep .  The instrument was then slowly withdrawn as the colon was fully examined.     COLON FINDINGS: Small internal hemorrhoids were found. Retroflexed views revealed no abnormalities.     The scope was then withdrawn from the patient and the procedure completed.  COMPLICATIONS: There were no complications. ENDOSCOPIC IMPRESSION: Small internal hemorrhoids Anusol HC supp hs, #12  RECOMMENDATIONS: High fiber diet   REPEAT EXAM: In 10 year(s)  for Colonoscopy.  cc:  _______________________________ eSignedHart Carwin, MD 04/04/2013 12:08 PM

## 2013-04-04 NOTE — Progress Notes (Signed)
Patient did not have preoperative order for IV antibiotic SSI prophylaxis. (G8918)  Patient did not experience any of the following events: a burn prior to discharge; a fall within the facility; wrong site/side/patient/procedure/implant event; or a hospital transfer or hospital admission upon discharge from the facility. (G8907)  

## 2013-04-04 NOTE — Patient Instructions (Addendum)

## 2013-04-05 ENCOUNTER — Telehealth: Payer: Self-pay | Admitting: *Deleted

## 2013-04-05 NOTE — Telephone Encounter (Signed)
Number identifier, left message, follow-up  

## 2013-08-24 ENCOUNTER — Encounter: Payer: Self-pay | Admitting: Endocrinology

## 2013-08-24 ENCOUNTER — Ambulatory Visit (INDEPENDENT_AMBULATORY_CARE_PROVIDER_SITE_OTHER): Payer: BC Managed Care – PPO | Admitting: Endocrinology

## 2013-08-24 VITALS — BP 126/82 | HR 60 | Temp 97.8°F | Resp 10 | Ht 62.25 in | Wt 152.4 lb

## 2013-08-24 DIAGNOSIS — R21 Rash and other nonspecific skin eruption: Secondary | ICD-10-CM

## 2013-08-24 DIAGNOSIS — Z780 Asymptomatic menopausal state: Secondary | ICD-10-CM

## 2013-08-24 DIAGNOSIS — Z23 Encounter for immunization: Secondary | ICD-10-CM

## 2013-08-24 DIAGNOSIS — Z0000 Encounter for general adult medical examination without abnormal findings: Secondary | ICD-10-CM

## 2013-08-24 DIAGNOSIS — E119 Type 2 diabetes mellitus without complications: Secondary | ICD-10-CM | POA: Insufficient documentation

## 2013-08-24 LAB — HEMOGLOBIN A1C
Hgb A1c MFr Bld: 6.2 % — ABNORMAL HIGH (ref <5.7)
Mean Plasma Glucose: 131 mg/dL — ABNORMAL HIGH (ref <117)

## 2013-08-24 MED ORDER — CLOTRIMAZOLE-BETAMETHASONE 1-0.05 % EX CREA: TOPICAL_CREAM | Freq: Three times a day (TID) | CUTANEOUS | Status: DC | PRN

## 2013-08-24 NOTE — Patient Instructions (Addendum)
please consider these measures for your health:  minimize alcohol.  do not use tobacco products.  have a colonoscopy at least every 10 years from age 59.  Women should have an annual mammogram from age 4.  keep firearms safely stored.  always use seat belts.  have working smoke alarms in your home.  see an eye doctor and dentist regularly.  never drive under the influence of alcohol or drugs (including prescription drugs).  please let me know what your wishes would be, if artificial life support measures should become necessary.  it is critically important to prevent falling down (keep floor areas well-lit, dry, and free of loose objects.  If you have a cane, walker, or wheelchair, you should use it, even for short trips around the house.  Also, try not to rush) blood tests are being requested for you today.  We'll contact you with results. Please come back for a follow-up appointment in 6 months. [phone], to make an appointment for a mammogram

## 2013-08-24 NOTE — Progress Notes (Signed)
Subjective:    Patient ID: Danielle Patterson, female    DOB: 07/21/1951, 62 y.o.   MRN: [id number]  HPI Pt is here for regular wellness examination, and is feeling pretty well in general, and says chronic med probs are stable, except as noted below Past Medical History  Diagnosis Date  . HYPERCHOLESTEROLEMIA   . HYPERTENSION   . GERD   . RENAL CYST, LEFT   . SPONDYLOSIS UNSPEC SITE W/O MENTION MYELOPATHY   . Menopause   . Hyperglycemia   . Non-cardiogenic pulmonary edema   . Diabetes mellitus without complication     type 2    Past Surgical History  Procedure Laterality Date  . Abdominal hysterectomy    . Tubal ligation    . Cyst excision      chest    History   Social History  . Marital Status: Single    Spouse Name: N/A    Number of Children: N/A  . Years of Education: N/A   Occupational History  . Not on file.   Social History Main Topics  . Smoking status: Former Smoker    Quit date: 10/25/1997  . Smokeless tobacco: Never Used  . Alcohol Use: No  . Drug Use: No  . Sexual Activity: Not on file   Other Topics Concern  . Not on file   Social History Narrative  . No narrative on file    Current Outpatient Prescriptions on File Prior to Visit  Medication Sig Dispense Refill  . aspirin EC 81 MG tablet Take 81 mg by mouth daily.       . calcium carbonate 1250 MG capsule Take 1,250 mg by mouth daily.        . hydrocortisone (ANUSOL-HC) 25 MG suppository Place 1 suppository (25 mg total) rectally 2 (two) times daily as needed for hemorrhoids (rectal irritation).  12 suppository  1  . ibuprofen (ADVIL,MOTRIN) 200 MG tablet Take 400 mg by mouth every 6 (six) hours as needed. Pain      . losartan-hydrochlorothiazide (HYZAAR) 100-25 MG per tablet Take 1 tablet by mouth daily.  30 tablet  11  . metFORMIN (GLUCOPHAGE-XR) 500 MG 24 hr tablet Take 1 tablet (500 mg total) by mouth daily with breakfast.  30 tablet  11  . simvastatin (ZOCOR) 80 MG tablet Take 1 tablet (80  mg total) by mouth at bedtime.  30 tablet  11   No current facility-administered medications on file prior to visit.    No Known Allergies  Family History  Problem Relation Age of Onset  . Breast cancer Mother   . Nephrolithiasis Mother   . Hypertension Father   . Colon cancer Neg Hx     BP 126/82  Pulse 60  Temp(Src) 97.8 F (36.6 C) (Oral)  Resp 10  Ht 5' 2.25" (1.581 m)  Wt 152 lb 6.4 oz (69.128 kg)  BMI 27.66 kg/m2  SpO2 98%     Review of Systems  Constitutional: Negative for unexpected weight change.  HENT: Negative for hearing loss.   Eyes: Negative for visual disturbance.  Respiratory: Negative for shortness of breath.   Cardiovascular: Negative for chest pain.  Gastrointestinal: Negative for anal bleeding.  Endocrine: Negative for cold intolerance.  Genitourinary: Negative for hematuria.  Musculoskeletal: Negative for back pain.  Allergic/Immunologic: Negative for environmental allergies.  Neurological: Negative for syncope and numbness.  Psychiatric/Behavioral: Negative for dysphoric mood.       Objective:   Physical Exam VS: see vs page  GEN: no distress HEAD: head: no deformity eyes: no periorbital swelling, no proptosis external nose and ears are normal mouth: no lesion seen NECK: supple, thyroid is not enlarged CHEST WALL: no deformity LUNGS:  Clear to auscultation BREASTS:  No mass.  No d/c CV: reg rate and rhythm, no murmur ABD: abdomen is soft, nontender.  no hepatosplenomegaly.  not distended.  no hernia GENITALIA:  Normal external female.  Normal bimanual exam RECTAL: normal external and internal exam.  heme neg MUSCULOSKELETAL: muscle bulk and strength are grossly normal.  no obvious joint swelling.  gait is normal and steady PULSES: no carotid bruit NEURO:  cn 2-12 grossly intact.   readily moves all 4's.   SKIN:  Normal texture and temperature.  No rash or suspicious lesion is visible.   NODES:  None palpable at the neck PSYCH:  alert, oriented x3.  Does not appear anxious nor depressed.   i reviewed electrocardiogram.      Assessment & Plan:  Wellness visit today, with problems stable, except as noted.  we discussed code status.  pt requests full code, but would not want to be started or maintained on artificial life-support measures if there was not a reasonable chance of recovery.      SEPARATE EVALUATION FOLLOWS--EACH PROBLEM HERE IS NEW, NOT RESPONDING TO TREATMENT, OR POSES SIGNIFICANT RISK TO THE PATIENT'S HEALTH: HISTORY OF THE PRESENT ILLNESS: Pt states few mos of moderate itching of the feet, but no assoc rash.  PAST MEDICAL HISTORY reviewed and up to date today REVIEW OF SYSTEMS: Denies fever PHYSICAL EXAMINATION: VITAL SIGNS:  See vs page GENERAL: no distress Skin: There is a moderate eczematous rash on the feet, (L>R). IMPRESSION: Rash, new, uncertain etiology PLAN: See instruction page

## 2013-08-25 DIAGNOSIS — Z0000 Encounter for general adult medical examination without abnormal findings: Secondary | ICD-10-CM | POA: Insufficient documentation

## 2013-10-10 ENCOUNTER — Other Ambulatory Visit: Payer: Self-pay

## 2013-10-10 MED ORDER — METFORMIN HCL ER 500 MG PO TB24: 500.0000 mg | ORAL_TABLET | Freq: Every day | ORAL | Status: DC

## 2013-11-23 ENCOUNTER — Other Ambulatory Visit: Payer: Self-pay | Admitting: Endocrinology

## 2013-11-26 ENCOUNTER — Other Ambulatory Visit: Payer: Self-pay | Admitting: Endocrinology

## 2013-11-27 ENCOUNTER — Other Ambulatory Visit: Payer: Self-pay | Admitting: Endocrinology

## 2013-12-05 ENCOUNTER — Other Ambulatory Visit: Payer: Self-pay

## 2013-12-05 MED ORDER — SIMVASTATIN 80 MG PO TABS: 80.0000 mg | ORAL_TABLET | Freq: Every day | ORAL | Status: DC

## 2013-12-06 ENCOUNTER — Ambulatory Visit (INDEPENDENT_AMBULATORY_CARE_PROVIDER_SITE_OTHER): Payer: 59 | Admitting: Endocrinology

## 2013-12-10 ENCOUNTER — Ambulatory Visit (INDEPENDENT_AMBULATORY_CARE_PROVIDER_SITE_OTHER): Payer: BC Managed Care – PPO

## 2013-12-10 DIAGNOSIS — Z111 Encounter for screening for respiratory tuberculosis: Secondary | ICD-10-CM

## 2013-12-12 LAB — TB SKIN TEST: TB Skin Test: NEGATIVE

## 2014-01-10 ENCOUNTER — Other Ambulatory Visit: Payer: 59

## 2014-01-10 ENCOUNTER — Ambulatory Visit (INDEPENDENT_AMBULATORY_CARE_PROVIDER_SITE_OTHER): Payer: 59 | Admitting: Endocrinology

## 2014-01-10 ENCOUNTER — Encounter: Payer: Self-pay | Admitting: Endocrinology

## 2014-01-10 VITALS — BP 142/98 | HR 85 | Temp 98.0°F | Ht 62.0 in | Wt 143.0 lb

## 2014-01-10 DIAGNOSIS — S20219A Contusion of unspecified front wall of thorax, initial encounter: Secondary | ICD-10-CM

## 2014-01-10 DIAGNOSIS — S20211A Contusion of right front wall of thorax, initial encounter: Secondary | ICD-10-CM

## 2014-01-10 MED ORDER — OXYCODONE-ACETAMINOPHEN 10-325 MG PO TABS: 1.0000 | ORAL_TABLET | ORAL | Status: DC | PRN
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
## 2014-01-10 NOTE — Patient Instructions (Signed)
Let's check a CT scan.   Here is a prescription for a stronger pain pill.   I hope you feel better soon.  If you don't feel better by next week, please call back.

## 2014-01-11 ENCOUNTER — Ambulatory Visit (INDEPENDENT_AMBULATORY_CARE_PROVIDER_SITE_OTHER)
Admission: RE | Admit: 2014-01-11 | Discharge: 2014-01-11 | Disposition: A | Discharge status: Home / Self Care | Payer: Self-pay | Source: Ambulatory Visit | Attending: Endocrinology | Admitting: Endocrinology

## 2014-01-11 DIAGNOSIS — S20211A Contusion of right front wall of thorax, initial encounter: Secondary | ICD-10-CM

## 2014-01-11 DIAGNOSIS — S20219A Contusion of unspecified front wall of thorax, initial encounter: Secondary | ICD-10-CM

## 2014-01-15 ENCOUNTER — Other Ambulatory Visit: Payer: Self-pay | Admitting: Endocrinology

## 2014-02-20 ENCOUNTER — Telehealth: Payer: Self-pay | Admitting: Endocrinology

## 2014-02-20 NOTE — Telephone Encounter (Signed)
Pt needs the results of the CT mailed to her. SV could not find them in EPIC. Please assist. Thanks

## 2014-02-20 NOTE — Telephone Encounter (Signed)
Results mailed to patient;.

## 2014-02-25 ENCOUNTER — Other Ambulatory Visit: Payer: Self-pay | Admitting: Endocrinology

## 2014-04-03 ENCOUNTER — Telehealth: Payer: Self-pay | Admitting: Endocrinology

## 2014-04-03 NOTE — Telephone Encounter (Signed)
Patient states she is having a heavy discharge and would like to see Dr. Everardo All There are no openings until he is back from vacation   Please advise patient on what to do    Thank You :)

## 2014-04-03 NOTE — Telephone Encounter (Signed)
Tomorrow at 1115

## 2014-04-03 NOTE — Telephone Encounter (Signed)
See below, Would it be ok to open another slot on Friday for pt to come for OV? Thanks!

## 2014-04-04 NOTE — Telephone Encounter (Signed)
Unable to reach pt on the listed phone numbers.

## 2014-04-04 NOTE — Telephone Encounter (Signed)
Tried to call pt 3 times and phone has been busy. Will try again.

## 2014-04-04 NOTE — Telephone Encounter (Signed)
Phone still busy.

## 2014-04-08 ENCOUNTER — Emergency Department (HOSPITAL_COMMUNITY)
Admission: EM | Admit: 2014-04-08 | Discharge: 2014-04-08 | Disposition: A | Discharge status: Home / Self Care | Payer: BC Managed Care – PPO | Source: Home / Self Care | Attending: Family Medicine | Admitting: Family Medicine

## 2014-04-08 ENCOUNTER — Encounter (HOSPITAL_COMMUNITY): Payer: Self-pay | Admitting: Emergency Medicine

## 2014-04-08 ENCOUNTER — Other Ambulatory Visit (HOSPITAL_COMMUNITY)
Admission: RE | Admit: 2014-04-08 | Discharge: 2014-04-08 | Disposition: A | Discharge status: Home / Self Care | Payer: BC Managed Care – PPO | Source: Ambulatory Visit | Attending: Family Medicine | Admitting: Family Medicine

## 2014-04-08 DIAGNOSIS — N760 Acute vaginitis: Secondary | ICD-10-CM | POA: Insufficient documentation

## 2014-04-08 DIAGNOSIS — N390 Urinary tract infection, site not specified: Secondary | ICD-10-CM

## 2014-04-08 DIAGNOSIS — Z113 Encounter for screening for infections with a predominantly sexual mode of transmission: Secondary | ICD-10-CM | POA: Insufficient documentation

## 2014-04-08 LAB — POCT URINALYSIS DIP (DEVICE)
BILIRUBIN URINE: NEGATIVE
GLUCOSE, UA: NEGATIVE mg/dL
KETONES UR: NEGATIVE mg/dL
Nitrite: NEGATIVE
Protein, ur: NEGATIVE mg/dL
UROBILINOGEN UA: 0.2 mg/dL (ref 0.0–1.0)
pH: 6 (ref 5.0–8.0)

## 2014-04-08 MED ORDER — CEPHALEXIN 500 MG PO CAPS: 500.0000 mg | ORAL_CAPSULE | Freq: Four times a day (QID) | ORAL | Status: DC

## 2014-04-08 MED ORDER — METRONIDAZOLE 0.75 % VA GEL: 1.0000 | Freq: Every day | VAGINAL | Status: DC

## 2014-04-08 NOTE — ED Notes (Signed)
C/o vaginal d/c x 3-4 days w odor, pain in left flank area ; NAD

## 2014-04-08 NOTE — Discharge Instructions (Signed)
Take all of medicine as directed, drink lots of fluids, see your doctor if further problems. °

## 2014-04-08 NOTE — ED Provider Notes (Signed)
CSN: [id number]     Arrival date & time 04/08/14  1345 History   First MD Initiated Contact with Patient 04/08/14 1539     Chief Complaint  Patient presents with  . Vaginal Discharge   (Consider location/radiation/quality/duration/timing/severity/associated sxs/prior Treatment) Patient is a 63 y.o. female presenting with vaginal discharge. The history is provided by the patient.  Vaginal Discharge Quality:  Clear and malodorous Severity:  Mild Onset quality:  Gradual Duration:  3 days Progression:  Unchanged Chronicity:  New Relieved by:  None tried Worsened by:  Nothing tried Ineffective treatments:  None tried Associated symptoms: no abdominal pain, no dyspareunia, no dysuria, no fever, no genital lesions, no urinary frequency, no urinary incontinence and no vaginal itching   Risk factors: no new sexual partner, no STI and no STI exposure     Past Medical History  Diagnosis Date  . HYPERCHOLESTEROLEMIA   . HYPERTENSION   . GERD   . RENAL CYST, LEFT   . SPONDYLOSIS UNSPEC SITE W/O MENTION MYELOPATHY   . Menopause   . Hyperglycemia   . Non-cardiogenic pulmonary edema   . Diabetes mellitus without complication     type 2   Past Surgical History  Procedure Laterality Date  . Abdominal hysterectomy    . Tubal ligation    . Cyst excision      chest   Family History  Problem Relation Age of Onset  . Breast cancer Mother   . Nephrolithiasis Mother   . Hypertension Father   . Colon cancer Neg Hx    History  Substance Use Topics  . Smoking status: Former Smoker    Quit date: 10/25/1997  . Smokeless tobacco: Never Used  . Alcohol Use: No   OB History   Grav Para Term Preterm Abortions TAB SAB Ect Mult Living                 Review of Systems  Constitutional: Negative.  Negative for fever.  Gastrointestinal: Negative.  Negative for abdominal pain and blood in stool.       S/p recent colonoscopy-[facility].  Genitourinary: Positive for vaginal discharge and  pelvic pain. Negative for bladder incontinence, dysuria, frequency, flank pain, vaginal bleeding, difficulty urinating, menstrual problem and dyspareunia.       S/p hyst, oophorectomy.    Allergies  Review of patient's allergies indicates no known allergies.  Home Medications   Prior to Admission medications   Medication Sig Start Date End Date Taking? Authorizing Provider  aspirin EC 81 MG tablet Take 81 mg by mouth daily.     Historical Provider, MD  calcium carbonate 1250 MG capsule Take 1,250 mg by mouth daily.      Historical Provider, MD  cephALEXin (KEFLEX) 500 MG capsule Take 1 capsule (500 mg total) by mouth 4 (four) times daily. Take all of medicine and drink lots of fluids 04/08/14   Linna HoffJames D Sahithi Ordoyne, MD  clotrimazole-betamethasone (LOTRISONE) cream Apply topically 3 (three) times daily as needed. For itching 08/24/13   Romero BellingSean Ellison, MD  hydrocortisone (ANUSOL-HC) 25 MG suppository Place 1 suppository (25 mg total) rectally 2 (two) times daily as needed for hemorrhoids (rectal irritation). 04/04/13   Hart Carwinora M Brodie, MD  ibuprofen (ADVIL,MOTRIN) 200 MG tablet Take 400 mg by mouth every 6 (six) hours as needed. Pain    Historical Provider, MD  losartan-hydrochlorothiazide (HYZAAR) 100-25 MG per tablet TAKE ONE TABLET BY MOUTH EVERY DAY 11/26/13   Romero BellingSean Ellison, MD  losartan-hydrochlorothiazide (HYZAAR) 100-25 MG per  tablet TAKE ONE TABLET BY MOUTH ONCE DAILY 02/25/14   Romero BellingSean Ellison, MD  metFORMIN (GLUCOPHAGE-XR) 500 MG 24 hr tablet Take 1 tablet (500 mg total) by mouth daily with breakfast. 10/10/13   Romero BellingSean Ellison, MD  metroNIDAZOLE (METROGEL VAGINAL) 0.75 % vaginal gel Place 1 Applicatorful vaginally at bedtime. At bedtime for 5 nights. 04/08/14   Linna HoffJames D Ferrell Claiborne, MD  oxyCODONE-acetaminophen (PERCOCET) 10-325 MG per tablet Take 1 tablet by mouth every 4 (four) hours as needed for pain. 01/10/14   Romero BellingSean Ellison, MD  simvastatin (ZOCOR) 80 MG tablet Take 1 tablet (80 mg total) by mouth at bedtime.  12/05/13   Romero BellingSean Ellison, MD   BP 188/102  Pulse 62  Temp(Src) 98.1 F (36.7 C)  Resp 16  SpO2 99% Physical Exam  Nursing note and vitals reviewed. Constitutional: She is oriented to person, place, and time. She appears well-developed and well-nourished.  Genitourinary: Vaginal discharge found.  Atrophic vag, , scant nonpurulent d/c, sl left adnexal pain, no masses, no cva tenderness.  Neurological: She is alert and oriented to person, place, and time.  Skin: Skin is warm and dry.    ED Course  Procedures (including critical care time) Labs Review Labs Reviewed  POCT URINALYSIS DIP (DEVICE) - Abnormal; Notable for the following:    Hgb urine dipstick SMALL (*)    Leukocytes, UA TRACE (*)    All other components within normal limits  CERVICOVAGINAL ANCILLARY ONLY    Imaging Review No results found.   MDM   1. Vaginitis   2. UTI (lower urinary tract infection)        Linna HoffJames D Dasiah Hooley, MD 04/08/14 [phone]

## 2014-05-08 ENCOUNTER — Ambulatory Visit (INDEPENDENT_AMBULATORY_CARE_PROVIDER_SITE_OTHER): Payer: BC Managed Care – PPO | Admitting: Endocrinology

## 2014-05-08 VITALS — BP 146/100 | HR 77 | Temp 98.1°F | Ht 62.0 in | Wt 128.0 lb

## 2014-05-08 DIAGNOSIS — M25552 Pain in left hip: Secondary | ICD-10-CM

## 2014-05-08 DIAGNOSIS — M25559 Pain in unspecified hip: Secondary | ICD-10-CM

## 2014-05-08 MED ORDER — LACTULOSE 10 GM/15ML PO SOLN: 30.0000 g | Freq: Three times a day (TID) | ORAL | Status: DC

## 2014-05-08 NOTE — Patient Instructions (Addendum)
Let's check an ultrasound.  Please see a specialist.  you will receive a phone call, about a day and time for an appointment i have sent a prescription to your pharmacy, for a drink to help the bowels.  We'll see if this helps the symptoms. I hope you feel better soon.  If you don't feel better by next week, please call back.  Please call sooner if you get worse.

## 2014-05-08 NOTE — Progress Notes (Signed)
Subjective:    Patient ID: Danielle Patterson, female    DOB: 20-Nov-1950, 63 y.o.   MRN: [id number]  HPI Pt states 1 month of moderate LLQ/left pelvic pain.  She was seen at urgent care, and rx'ed with abx.  Vag d/c is resolved, but pain persists.  She has had TAH/USO (pt does not recall which side). Past Medical History  Diagnosis Date  . HYPERCHOLESTEROLEMIA   . HYPERTENSION   . GERD   . RENAL CYST, LEFT   . SPONDYLOSIS UNSPEC SITE W/O MENTION MYELOPATHY   . Menopause   . Hyperglycemia   . Non-cardiogenic pulmonary edema   . Diabetes mellitus without complication     type 2    Past Surgical History  Procedure Laterality Date  . Abdominal hysterectomy    . Tubal ligation    . Cyst excision      chest    History   Social History  . Marital Status: Single    Spouse Name: N/A    Number of Children: N/A  . Years of Education: N/A   Occupational History  . Not on file.   Social History Main Topics  . Smoking status: Former Smoker    Quit date: 10/25/1997  . Smokeless tobacco: Never Used  . Alcohol Use: No  . Drug Use: No  . Sexual Activity: Not on file   Other Topics Concern  . Not on file   Social History Narrative  . No narrative on file    Current Outpatient Prescriptions on File Prior to Visit  Medication Sig Dispense Refill  . aspirin EC 81 MG tablet Take 81 mg by mouth daily.       . calcium carbonate 1250 MG capsule Take 1,250 mg by mouth daily.        . cephALEXin (KEFLEX) 500 MG capsule Take 1 capsule (500 mg total) by mouth 4 (four) times daily. Take all of medicine and drink lots of fluids  20 capsule  0  . clotrimazole-betamethasone (LOTRISONE) cream Apply topically 3 (three) times daily as needed. For itching  45 g  3  . hydrocortisone (ANUSOL-HC) 25 MG suppository Place 1 suppository (25 mg total) rectally 2 (two) times daily as needed for hemorrhoids (rectal irritation).  12 suppository  1  . ibuprofen (ADVIL,MOTRIN) 200 MG tablet Take 400 mg by  mouth every 6 (six) hours as needed. Pain      . losartan-hydrochlorothiazide (HYZAAR) 100-25 MG per tablet TAKE ONE TABLET BY MOUTH EVERY DAY  30 tablet  2  . losartan-hydrochlorothiazide (HYZAAR) 100-25 MG per tablet TAKE ONE TABLET BY MOUTH ONCE DAILY  30 tablet  2  . metFORMIN (GLUCOPHAGE-XR) 500 MG 24 hr tablet Take 1 tablet (500 mg total) by mouth daily with breakfast.  30 tablet  11  . metroNIDAZOLE (METROGEL VAGINAL) 0.75 % vaginal gel Place 1 Applicatorful vaginally at bedtime. At bedtime for 5 nights.  70 g  0  . oxyCODONE-acetaminophen (PERCOCET) 10-325 MG per tablet Take 1 tablet by mouth every 4 (four) hours as needed for pain.  30 tablet  0  . simvastatin (ZOCOR) 80 MG tablet Take 1 tablet (80 mg total) by mouth at bedtime.  30 tablet  2   No current facility-administered medications on file prior to visit.    No Known Allergies  Family History  Problem Relation Age of Onset  . Breast cancer Mother   . Nephrolithiasis Mother   . Hypertension Father   . Colon cancer  Neg Hx     BP 146/100  Pulse 77  Temp(Src) 98.1 F (36.7 C) (Oral)  Ht 5\' 2"  (1.575 m)  Wt 128 lb (58.06 kg)  BMI 23.41 kg/m2  SpO2 97%   Review of Systems Denies fever and brbpr.      Objective:   Physical Exam VITAL SIGNS:  See vs page.   GENERAL: no distress. ABDOMEN: abdomen is soft, nontender.  no hepatosplenomegaly. not distended.  no hernia.     i have reviewed the following outside records: office note from urgent care.    i reviewed radiol report (2010 CT pelvis ): TAH, but no mention of ovaries.      Assessment & Plan:  Left pelvic pain, new to me.   Patient is advised the following: Patient Instructions  Let's check an ultrasound.  Please see a specialist.  you will receive a phone call, about a day and time for an appointment i have sent a prescription to your pharmacy, for a drink to help the bowels.  We'll see if this helps the symptoms. I hope you feel better soon.  If you  don't feel better by next week, please call back.  Please call sooner if you get worse.

## 2014-05-09 ENCOUNTER — Telehealth: Payer: Self-pay

## 2014-05-09 NOTE — Telephone Encounter (Signed)
[facility] called requesting that US transvaginal order be placed.  Please advise, Thanks!

## 2014-05-09 NOTE — Telephone Encounter (Signed)
done

## 2014-05-13 ENCOUNTER — Ambulatory Visit
Admission: RE | Admit: 2014-05-13 | Discharge: 2014-05-13 | Disposition: A | Discharge status: Home / Self Care | Payer: BC Managed Care – PPO | Source: Ambulatory Visit | Attending: Endocrinology | Admitting: Endocrinology

## 2014-05-13 ENCOUNTER — Other Ambulatory Visit: Payer: Self-pay | Admitting: Endocrinology

## 2014-05-13 DIAGNOSIS — M25552 Pain in left hip: Secondary | ICD-10-CM

## 2014-07-09 ENCOUNTER — Encounter: Payer: Self-pay | Admitting: Endocrinology

## 2014-07-09 ENCOUNTER — Ambulatory Visit (INDEPENDENT_AMBULATORY_CARE_PROVIDER_SITE_OTHER): Payer: BC Managed Care – PPO | Admitting: Endocrinology

## 2014-07-09 VITALS — BP 130/96 | HR 61 | Temp 98.2°F | Wt 133.0 lb

## 2014-07-09 DIAGNOSIS — I10 Essential (primary) hypertension: Secondary | ICD-10-CM

## 2014-07-09 DIAGNOSIS — E7800 Pure hypercholesterolemia, unspecified: Secondary | ICD-10-CM

## 2014-07-09 DIAGNOSIS — E119 Type 2 diabetes mellitus without complications: Secondary | ICD-10-CM

## 2014-07-09 DIAGNOSIS — Z79899 Other long term (current) drug therapy: Secondary | ICD-10-CM

## 2014-07-09 DIAGNOSIS — Z23 Encounter for immunization: Secondary | ICD-10-CM

## 2014-07-09 DIAGNOSIS — Z0000 Encounter for general adult medical examination without abnormal findings: Secondary | ICD-10-CM

## 2014-07-09 LAB — BASIC METABOLIC PANEL
BUN: 18 mg/dL (ref 6–23)
CALCIUM: 9.4 mg/dL (ref 8.4–10.5)
CHLORIDE: 103 meq/L (ref 96–112)
CO2: 25 mEq/L (ref 19–32)
Creatinine, Ser: 0.8 mg/dL (ref 0.4–1.2)
GFR: 99.52 mL/min (ref >60.00)
Glucose, Bld: 80 mg/dL (ref 70–99)
Potassium: 3.7 mEq/L (ref 3.5–5.1)
Sodium: 136 mEq/L (ref 135–145)

## 2014-07-09 LAB — HEPATIC FUNCTION PANEL
ALK PHOS: 102 U/L (ref 39–117)
ALT: 13 U/L (ref 0–35)
AST: 14 U/L (ref 0–37)
Albumin: 4.3 g/dL (ref 3.5–5.2)
BILIRUBIN TOTAL: 0.6 mg/dL (ref 0.2–1.2)
Bilirubin, Direct: 0.1 mg/dL (ref 0.0–0.3)
TOTAL PROTEIN: 7.4 g/dL (ref 6.0–8.3)

## 2014-07-09 LAB — LIPID PANEL
CHOL/HDL RATIO: 3
Cholesterol: 205 mg/dL — ABNORMAL HIGH (ref 0–200)
HDL: 66.3 mg/dL (ref >39.00)
LDL CALC: 114 mg/dL — AB (ref 0–99)
NONHDL: 138.7
Triglycerides: 122 mg/dL (ref 0.0–149.0)
VLDL: 24.4 mg/dL (ref 0.0–40.0)

## 2014-07-09 LAB — URINALYSIS, ROUTINE W REFLEX MICROSCOPIC
BILIRUBIN URINE: NEGATIVE
KETONES UR: NEGATIVE
LEUKOCYTES UA: NEGATIVE
Nitrite: NEGATIVE
Specific Gravity, Urine: 1.025 (ref 1.000–1.030)
Total Protein, Urine: NEGATIVE
UROBILINOGEN UA: 0.2 (ref 0.0–1.0)
Urine Glucose: NEGATIVE
pH: 5.5 (ref 5.0–8.0)

## 2014-07-09 LAB — TSH: TSH: 0.14 u[IU]/mL — AB (ref 0.35–4.50)

## 2014-07-09 LAB — HEMOGLOBIN A1C: Hgb A1c MFr Bld: 5.9 % (ref 4.6–6.5)

## 2014-07-09 MED ORDER — SIMVASTATIN 40 MG PO TABS: 40.0000 mg | ORAL_TABLET | Freq: Every day | ORAL | Status: DC

## 2014-07-09 MED ORDER — AMLODIPINE BESYLATE 2.5 MG PO TABS: 2.5000 mg | ORAL_TABLET | Freq: Every day | ORAL | Status: DC

## 2014-07-09 NOTE — Progress Notes (Signed)
Subjective:    Patient ID: Danielle Patterson, female    DOB: Mar 30, 1951, 63 y.o.   MRN: [id number]  HPI The state of at least three ongoing medical problems is addressed today, with interval history of each noted here: HTN: pt says she takes the hyzaar as rx'ed, and tolerates well. DM: he denies weight change Constipation: lactulose works well. Past Medical History  Diagnosis Date  . HYPERCHOLESTEROLEMIA   . HYPERTENSION   . GERD   . RENAL CYST, LEFT   . SPONDYLOSIS UNSPEC SITE W/O MENTION MYELOPATHY   . Menopause   . Hyperglycemia   . Non-cardiogenic pulmonary edema   . Diabetes mellitus without complication     type 2    Past Surgical History  Procedure Laterality Date  . Abdominal hysterectomy    . Tubal ligation    . Cyst excision      chest    History   Social History  . Marital Status: Single    Spouse Name: N/A    Number of Children: N/A  . Years of Education: N/A   Occupational History  . Not on file.   Social History Main Topics  . Smoking status: Former Smoker    Quit date: 10/25/1997  . Smokeless tobacco: Never Used  . Alcohol Use: No  . Drug Use: No  . Sexual Activity: Not on file   Other Topics Concern  . Not on file   Social History Narrative  . No narrative on file    Current Outpatient Prescriptions on File Prior to Visit  Medication Sig Dispense Refill  . aspirin EC 81 MG tablet Take 81 mg by mouth daily.       . calcium carbonate 1250 MG capsule Take 1,250 mg by mouth daily.        . hydrocortisone (ANUSOL-HC) 25 MG suppository Place 1 suppository (25 mg total) rectally 2 (two) times daily as needed for hemorrhoids (rectal irritation).  12 suppository  1  . ibuprofen (ADVIL,MOTRIN) 200 MG tablet Take 400 mg by mouth every 6 (six) hours as needed. Pain      . lactulose (CHRONULAC) 10 GM/15ML solution Take 45 mLs (30 g total) by mouth 3 (three) times daily.  500 mL  2  . losartan-hydrochlorothiazide (HYZAAR) 100-25 MG per tablet TAKE  ONE TABLET BY MOUTH EVERY DAY  30 tablet  2  . metFORMIN (GLUCOPHAGE-XR) 500 MG 24 hr tablet Take 1 tablet (500 mg total) by mouth daily with breakfast.  30 tablet  11  . metroNIDAZOLE (METROGEL VAGINAL) 0.75 % vaginal gel Place 1 Applicatorful vaginally at bedtime. At bedtime for 5 nights.  70 g  0  . oxyCODONE-acetaminophen (PERCOCET) 10-325 MG per tablet Take 1 tablet by mouth every 4 (four) hours as needed for pain.  30 tablet  0   No current facility-administered medications on file prior to visit.    No Known Allergies  Family History  Problem Relation Age of Onset  . Breast cancer Mother   . Nephrolithiasis Mother   . Hypertension Father   . Colon cancer Neg Hx     BP 130/96  Pulse 61  Temp(Src) 98.2 F (36.8 C) (Oral)  Wt 133 lb (60.328 kg)  SpO2 99%    Review of Systems Denies sob and chest pain.    Objective:   Physical Exam VITAL SIGNS:  See vs page GENERAL: no distress Pulses: dorsalis pedis intact bilat.   Feet: no deformity.  no edema Skin:  no ulcer on the feet.  normal color and temp. Neuro: sensation is intact to touch on the feet    Lab Results  Component Value Date   WBC 5.4 07/09/2014   HGB 12.4 07/09/2014   HCT 37.7 07/09/2014   PLT 168.0 07/09/2014   GLUCOSE 80 07/09/2014   CHOL 205* 07/09/2014   TRIG 122.0 07/09/2014   HDL 66.30 07/09/2014   LDLDIRECT 160.0 02/06/2009   LDLCALC 114* 07/09/2014   ALT 13 07/09/2014   AST 14 07/09/2014   NA 136 07/09/2014   K 3.7 07/09/2014   CL 103 07/09/2014   CREATININE 0.8 07/09/2014   BUN 18 07/09/2014   CO2 25 07/09/2014   TSH 0.14* 07/09/2014   HGBA1C 5.9 07/09/2014   MICROALBUR 2.6* 07/09/2014       Assessment & Plan:  Constipation: well-controlled HTN: mild exacerbation DM: uncertain control Abnormal TSH, new, uncertain etiology  Patient is advised the following: Patient Instructions  blood tests are being requested for you today.  We'll contact you with results.  Please come back for annual physical  appointment in 2 months.  i have sent prescriptions to your pharmacy: an additional blood pressure pill, and to reduce the simvastatin.

## 2014-07-09 NOTE — Patient Instructions (Addendum)
blood tests are being requested for you today.  We'll contact you with results.  Please come back for annual physical appointment in 2 months.  i have sent prescriptions to your pharmacy: an additional blood pressure pill, and to reduce the simvastatin.

## 2014-07-10 LAB — CBC WITH DIFFERENTIAL/PLATELET
BASOS ABS: 0 10*3/uL (ref 0.0–0.1)
Basophils Relative: 0.4 % (ref 0.0–3.0)
Eosinophils Absolute: 0.1 10*3/uL (ref 0.0–0.7)
Eosinophils Relative: 2.4 % (ref 0.0–5.0)
HCT: 37.7 % (ref 36.0–46.0)
HEMOGLOBIN: 12.4 g/dL (ref 12.0–15.0)
Lymphocytes Relative: 22.9 % (ref 12.0–46.0)
Lymphs Abs: 1.2 10*3/uL (ref 0.7–4.0)
MCHC: 33.1 g/dL (ref 30.0–36.0)
MCV: 80.4 fl (ref 78.0–100.0)
MONOS PCT: 6.6 % (ref 3.0–12.0)
Monocytes Absolute: 0.4 10*3/uL (ref 0.1–1.0)
NEUTROS ABS: 3.7 10*3/uL (ref 1.4–7.7)
NEUTROS PCT: 67.7 % (ref 43.0–77.0)
Platelets: 168 10*3/uL (ref 150.0–400.0)
RBC: 4.68 Mil/uL (ref 3.87–5.11)
RDW: 16.1 % — ABNORMAL HIGH (ref 11.5–15.5)
WBC: 5.4 10*3/uL (ref 4.0–10.5)

## 2014-07-10 LAB — MICROALBUMIN / CREATININE URINE RATIO
CREATININE, U: 156.4 mg/dL
MICROALB UR: 2.6 mg/dL — AB (ref 0.0–1.9)
Microalb Creat Ratio: 1.7 mg/g (ref 0.0–30.0)

## 2014-07-24 ENCOUNTER — Telehealth: Payer: Self-pay | Admitting: Endocrinology

## 2014-07-24 NOTE — Telephone Encounter (Signed)
Sorry, this is outside the scope of my practice.  Please see a dentist.

## 2014-07-24 NOTE — Telephone Encounter (Signed)
Pt has an absess on her gums could we call in an antibiotic for her?

## 2014-07-24 NOTE — Telephone Encounter (Signed)
See below and please advise, Thanks!  

## 2014-07-24 NOTE — Telephone Encounter (Signed)
Pt advised. She states that she will follow up with her dentist.

## 2014-09-01 ENCOUNTER — Other Ambulatory Visit: Payer: Self-pay | Admitting: Endocrinology

## 2014-10-14 ENCOUNTER — Other Ambulatory Visit: Payer: Self-pay | Admitting: Endocrinology

## 2014-11-16 ENCOUNTER — Other Ambulatory Visit: Payer: Self-pay | Admitting: Endocrinology

## 2014-12-24 ENCOUNTER — Ambulatory Visit (INDEPENDENT_AMBULATORY_CARE_PROVIDER_SITE_OTHER): Payer: BLUE CROSS/BLUE SHIELD | Admitting: Endocrinology

## 2014-12-24 ENCOUNTER — Encounter: Payer: Self-pay | Admitting: Endocrinology

## 2014-12-24 VITALS — BP 132/92 | HR 73 | Temp 98.3°F | Ht 62.0 in | Wt 123.0 lb

## 2014-12-24 DIAGNOSIS — Z23 Encounter for immunization: Secondary | ICD-10-CM

## 2014-12-24 DIAGNOSIS — Z111 Encounter for screening for respiratory tuberculosis: Secondary | ICD-10-CM

## 2014-12-24 DIAGNOSIS — Z720 Tobacco use: Secondary | ICD-10-CM

## 2014-12-24 DIAGNOSIS — F17200 Nicotine dependence, unspecified, uncomplicated: Secondary | ICD-10-CM

## 2014-12-24 DIAGNOSIS — R946 Abnormal results of thyroid function studies: Secondary | ICD-10-CM | POA: Insufficient documentation

## 2014-12-24 DIAGNOSIS — I10 Essential (primary) hypertension: Secondary | ICD-10-CM

## 2014-12-24 DIAGNOSIS — Z0000 Encounter for general adult medical examination without abnormal findings: Secondary | ICD-10-CM

## 2014-12-24 DIAGNOSIS — E119 Type 2 diabetes mellitus without complications: Secondary | ICD-10-CM

## 2014-12-24 LAB — TSH: TSH: 0.54 u[IU]/mL (ref 0.35–4.50)

## 2014-12-24 LAB — HEMOGLOBIN A1C: Hgb A1c MFr Bld: 5.8 % (ref 4.6–6.5)

## 2014-12-24 MED ORDER — CLOTRIMAZOLE-BETAMETHASONE 1-0.05 % EX CREA: 1.0000 "application " | TOPICAL_CREAM | Freq: Three times a day (TID) | CUTANEOUS | Status: DC

## 2014-12-24 NOTE — Patient Instructions (Addendum)
please consider these measures for your health:  minimize alcohol.  do not use tobacco products.  have a colonoscopy at least every 10 years from age 64.  keep firearms safely stored.  always use seat belts.  have working smoke alarms in your home.  see an eye doctor and dentist regularly.  never drive under the influence of alcohol or drugs (including prescription drugs).   you will receive a phone call, about a day and time for an appointment, for your mammogram.   blood tests are being requested for you today.  We'll let you know about the results.   Please come back for a follow-up appointment in 6 months.

## 2014-12-24 NOTE — Progress Notes (Signed)
we discussed code status.  pt requests full code, but would not want to be started or maintained on artificial life-support measures if there was not a reasonable chance of recovery 

## 2014-12-24 NOTE — Progress Notes (Signed)
Subjective:    Patient ID: Danielle Patterson, female    DOB: 1950-11-21, 64 y.o.   MRN: [id number]  HPI Pt is here for regular wellness examination, and is feeling pretty well in general, and says chronic med probs are stable, except as noted below Past Medical History  Diagnosis Date  . HYPERCHOLESTEROLEMIA   . HYPERTENSION   . GERD   . RENAL CYST, LEFT   . SPONDYLOSIS UNSPEC SITE W/O MENTION MYELOPATHY   . Menopause   . Hyperglycemia   . Non-cardiogenic pulmonary edema   . Diabetes mellitus without complication     type 2    Past Surgical History  Procedure Laterality Date  . Abdominal hysterectomy    . Tubal ligation    . Cyst excision      chest    History   Social History  . Marital Status: Single    Spouse Name: N/A  . Number of Children: N/A  . Years of Education: N/A   Occupational History  . Not on file.   Social History Main Topics  . Smoking status: Former Smoker    Quit date: 10/25/1997  . Smokeless tobacco: Never Used  . Alcohol Use: No  . Drug Use: No  . Sexual Activity: Not on file   Other Topics Concern  . Not on file   Social History Narrative    Current Outpatient Prescriptions on File Prior to Visit  Medication Sig Dispense Refill  . amLODipine (NORVASC) 2.5 MG tablet Take 1 tablet (2.5 mg total) by mouth daily. 30 tablet 11  . aspirin EC 81 MG tablet Take 81 mg by mouth daily.     . calcium carbonate 1250 MG capsule Take 1,250 mg by mouth daily.      Marland Kitchen ibuprofen (ADVIL,MOTRIN) 200 MG tablet Take 400 mg by mouth every 6 (six) hours as needed. Pain    . lactulose (CHRONULAC) 10 GM/15ML solution Take 45 mLs (30 g total) by mouth 3 (three) times daily. 500 mL 2  . losartan-hydrochlorothiazide (HYZAAR) 100-25 MG per tablet TAKE ONE TABLET BY MOUTH EVERY DAY 30 tablet 2  . metFORMIN (GLUCOPHAGE-XR) 500 MG 24 hr tablet TAKE ONE TABLET BY MOUTH ONCE DAILY WITH BREAKFAST 30 tablet 0  . metroNIDAZOLE (METROGEL VAGINAL) 0.75 % vaginal gel Place  1 Applicatorful vaginally at bedtime. At bedtime for 5 nights. 70 g 0  . simvastatin (ZOCOR) 40 MG tablet Take 1 tablet (40 mg total) by mouth at bedtime. 30 tablet 11   No current facility-administered medications on file prior to visit.    No Known Allergies  Family History  Problem Relation Age of Onset  . Breast cancer Mother   . Nephrolithiasis Mother   . Hypertension Father   . Colon cancer Neg Hx     BP 132/92 mmHg  Pulse 73  Temp(Src) 98.3 F (36.8 C) (Oral)  Ht  (1.575 m)  Wt 123 lb (55.792 kg)  BMI 22.49 kg/m2  SpO2 98%   Review of Systems  Constitutional: Negative for fever.  HENT: Negative for hearing loss.   Eyes: Negative for visual disturbance.  Respiratory: Negative for shortness of breath.   Cardiovascular: Negative for chest pain.  Gastrointestinal: Negative for anal bleeding.  Endocrine: Negative for cold intolerance.  Genitourinary: Negative for hematuria.  Musculoskeletal: Negative for back pain.  Allergic/Immunologic: Negative for environmental allergies.  Neurological: Negative for syncope and numbness.  Psychiatric/Behavioral: Negative for dysphoric mood.   She has lost a few  more lbs.    Objective:   Physical Exam VS: see vs page GEN: no distress HEAD: head: no deformity eyes: no periorbital swelling, no proptosis external nose and ears are normal mouth: no lesion seen NECK: supple, thyroid is not enlarged CHEST WALL: no deformity LUNGS:  Clear to auscultation CV: reg rate and rhythm, no murmur ABD: abdomen is soft, nontender.  no hepatosplenomegaly.  not distended.  no hernia MUSCULOSKELETAL: muscle bulk and strength are grossly normal.  no obvious joint swelling.  gait is normal and steady EXTEMITIES: no deformity.  no ulcer on the feet.  feet are of normal color and temp.  no edema PULSES: dorsalis pedis intact bilat.  no carotid bruit NEURO:  cn 2-12 grossly intact.   readily moves all 4's.  sensation is intact to touch on  the feet SKIN:  Normal texture and temperature.  No rash or suspicious lesion is visible.   NODES:  None palpable at the neck PSYCH: alert, well-oriented.  Does not appear anxious nor depressed.    i personally reviewed electrocardiogram tracing: normal.      Assessment & Plan:  Wellness visit today, with problems stable, except as noted.    SEPARATE EVALUATION FOLLOWS--EACH PROBLEM HERE IS NEW, NOT RESPONDING TO TREATMENT, OR POSES SIGNIFICANT RISK TO THE PATIENT'S HEALTH: HISTORY OF THE PRESENT ILLNESS:  Pt states few years of intermittent rash on the left foot, and assoc itching.   PAST MEDICAL HISTORY reviewed and up to date today REVIEW OF SYSTEMS: Denies open wound PHYSICAL EXAMINATION: VITAL SIGNS:  See vs page GENERAL: no distress   Skin:  no ulcer on the feet.  normal color and temp on the feet, except for moderate eczematous rash on the left foot, arch area.   LAB/XRAY RESULTS: IMPRESSION: Rash, new, due to eczema vs tines pedis PLAN: i have sent a prescription to your pharmacy, for a skin cream for your foot rash

## 2014-12-26 LAB — TB SKIN TEST
Induration: 0 mm
TB SKIN TEST: NEGATIVE

## 2014-12-31 ENCOUNTER — Other Ambulatory Visit: Payer: Self-pay | Admitting: Endocrinology

## 2014-12-31 DIAGNOSIS — Z1231 Encounter for screening mammogram for malignant neoplasm of breast: Secondary | ICD-10-CM

## 2015-01-02 ENCOUNTER — Other Ambulatory Visit: Payer: Self-pay | Admitting: Endocrinology

## 2015-01-03 ENCOUNTER — Other Ambulatory Visit: Payer: Self-pay | Admitting: *Deleted

## 2015-01-03 MED ORDER — LOSARTAN POTASSIUM-HCTZ 100-25 MG PO TABS: 1.0000 | ORAL_TABLET | Freq: Every day | ORAL | Status: DC

## 2015-01-08 ENCOUNTER — Ambulatory Visit
Admission: RE | Admit: 2015-01-08 | Discharge: 2015-01-08 | Disposition: A | Discharge status: Home / Self Care | Payer: BLUE CROSS/BLUE SHIELD | Source: Ambulatory Visit | Attending: Endocrinology | Admitting: Endocrinology

## 2015-01-08 DIAGNOSIS — Z1231 Encounter for screening mammogram for malignant neoplasm of breast: Secondary | ICD-10-CM

## 2015-03-20 ENCOUNTER — Other Ambulatory Visit: Payer: Self-pay | Admitting: Endocrinology

## 2015-05-06 ENCOUNTER — Other Ambulatory Visit: Payer: Self-pay | Admitting: Endocrinology

## 2015-09-25 ENCOUNTER — Other Ambulatory Visit: Payer: Self-pay | Admitting: Endocrinology

## 2015-12-03 ENCOUNTER — Other Ambulatory Visit: Payer: Self-pay | Admitting: Endocrinology

## 2015-12-23 ENCOUNTER — Ambulatory Visit (INDEPENDENT_AMBULATORY_CARE_PROVIDER_SITE_OTHER): Payer: BLUE CROSS/BLUE SHIELD

## 2015-12-23 DIAGNOSIS — Z111 Encounter for screening for respiratory tuberculosis: Secondary | ICD-10-CM

## 2015-12-25 LAB — TB SKIN TEST: TB Skin Test: NEGATIVE

## 2016-01-12 ENCOUNTER — Other Ambulatory Visit: Payer: Self-pay | Admitting: Endocrinology

## 2016-01-14 ENCOUNTER — Encounter: Payer: Self-pay | Admitting: Endocrinology

## 2016-01-14 ENCOUNTER — Ambulatory Visit (INDEPENDENT_AMBULATORY_CARE_PROVIDER_SITE_OTHER): Payer: Self-pay | Admitting: Endocrinology

## 2016-01-14 VITALS — BP 152/99 | HR 99 | Temp 98.0°F | Ht 62.0 in | Wt 135.0 lb

## 2016-01-14 DIAGNOSIS — F17200 Nicotine dependence, unspecified, uncomplicated: Secondary | ICD-10-CM

## 2016-01-14 DIAGNOSIS — N951 Menopausal and female climacteric states: Secondary | ICD-10-CM | POA: Insufficient documentation

## 2016-01-14 DIAGNOSIS — L740 Miliaria rubra: Secondary | ICD-10-CM

## 2016-01-14 DIAGNOSIS — E119 Type 2 diabetes mellitus without complications: Secondary | ICD-10-CM

## 2016-01-14 DIAGNOSIS — Z0000 Encounter for general adult medical examination without abnormal findings: Secondary | ICD-10-CM

## 2016-01-14 DIAGNOSIS — I10 Essential (primary) hypertension: Secondary | ICD-10-CM

## 2016-01-14 DIAGNOSIS — R1032 Left lower quadrant pain: Secondary | ICD-10-CM

## 2016-01-14 DIAGNOSIS — Z0001 Encounter for general adult medical examination with abnormal findings: Secondary | ICD-10-CM

## 2016-01-14 LAB — TSH: TSH: 0.36 u[IU]/mL (ref 0.35–4.50)

## 2016-01-14 LAB — URINALYSIS, ROUTINE W REFLEX MICROSCOPIC
Bilirubin Urine: NEGATIVE
Hgb urine dipstick: NEGATIVE
KETONES UR: NEGATIVE
LEUKOCYTES UA: NEGATIVE
Nitrite: NEGATIVE
PH: 6 (ref 5.0–8.0)
SPECIFIC GRAVITY, URINE: 1.02 (ref 1.000–1.030)
TOTAL PROTEIN, URINE-UPE24: NEGATIVE
URINE GLUCOSE: NEGATIVE
UROBILINOGEN UA: 0.2 (ref 0.0–1.0)
WBC, UA: NONE SEEN (ref 0–?)

## 2016-01-14 LAB — CBC WITH DIFFERENTIAL/PLATELET
BASOS ABS: 0 10*3/uL (ref 0.0–0.1)
Basophils Relative: 0.4 % (ref 0.0–3.0)
EOS ABS: 0.2 10*3/uL (ref 0.0–0.7)
Eosinophils Relative: 2.5 % (ref 0.0–5.0)
HEMATOCRIT: 44.3 % (ref 36.0–46.0)
Hemoglobin: 14.4 g/dL (ref 12.0–15.0)
LYMPHS ABS: 1.4 10*3/uL (ref 0.7–4.0)
LYMPHS PCT: 20.4 % (ref 12.0–46.0)
MCHC: 32.5 g/dL (ref 30.0–36.0)
MCV: 79.6 fl (ref 78.0–100.0)
Monocytes Absolute: 0.5 10*3/uL (ref 0.1–1.0)
Monocytes Relative: 7.4 % (ref 3.0–12.0)
NEUTROS ABS: 4.8 10*3/uL (ref 1.4–7.7)
NEUTROS PCT: 69.3 % (ref 43.0–77.0)
PLATELETS: 204 10*3/uL (ref 150.0–400.0)
RBC: 5.57 Mil/uL — AB (ref 3.87–5.11)
RDW: 14.6 % (ref 11.5–15.5)
WBC: 6.9 10*3/uL (ref 4.0–10.5)

## 2016-01-14 LAB — HEPATIC FUNCTION PANEL
ALBUMIN: 4.8 g/dL (ref 3.5–5.2)
ALT: 16 U/L (ref 0–35)
AST: 21 U/L (ref 0–37)
Alkaline Phosphatase: 102 U/L (ref 39–117)
BILIRUBIN DIRECT: 0.2 mg/dL (ref 0.0–0.3)
TOTAL PROTEIN: 8.1 g/dL (ref 6.0–8.3)
Total Bilirubin: 1 mg/dL (ref 0.2–1.2)

## 2016-01-14 LAB — LIPID PANEL
CHOL/HDL RATIO: 2
Cholesterol: 165 mg/dL (ref 0–200)
HDL: 71 mg/dL (ref >39.00)
LDL CALC: 84 mg/dL (ref 0–99)
NONHDL: 94.04
Triglycerides: 49 mg/dL (ref 0.0–149.0)
VLDL: 9.8 mg/dL (ref 0.0–40.0)

## 2016-01-14 LAB — BASIC METABOLIC PANEL
BUN: 15 mg/dL (ref 6–23)
CO2: 31 mEq/L (ref 19–32)
Calcium: 10.3 mg/dL (ref 8.4–10.5)
Chloride: 104 mEq/L (ref 96–112)
Creatinine, Ser: 0.74 mg/dL (ref 0.40–1.20)
GFR: 102.12 mL/min (ref >60.00)
Glucose, Bld: 103 mg/dL — ABNORMAL HIGH (ref 70–99)
POTASSIUM: 4.4 meq/L (ref 3.5–5.1)
SODIUM: 141 meq/L (ref 135–145)

## 2016-01-14 LAB — MICROALBUMIN / CREATININE URINE RATIO
CREATININE, U: 195.7 mg/dL
MICROALB UR: 3.9 mg/dL — AB (ref 0.0–1.9)
Microalb Creat Ratio: 2 mg/g (ref 0.0–30.0)

## 2016-01-14 MED ORDER — TRIAMCINOLONE ACETONIDE 0.1 % EX CREA: 1.0000 "application " | TOPICAL_CREAM | Freq: Four times a day (QID) | CUTANEOUS | Status: DC

## 2016-01-14 NOTE — Progress Notes (Signed)
Subjective:    Patient ID: Danielle Patterson, female    DOB: [birth year], 65 y.o.   MRN: [id number]  HPI Pt is here for regular wellness examination, and is feeling pretty well in general, and says chronic med probs are stable, except as noted below Past Medical History  Diagnosis Date  . HYPERCHOLESTEROLEMIA   . HYPERTENSION   . GERD   . RENAL CYST, LEFT   . SPONDYLOSIS UNSPEC SITE W/O MENTION MYELOPATHY   . Menopause   . Hyperglycemia   . Non-cardiogenic pulmonary edema   . Diabetes mellitus without complication (HCC)     type 2    Past Surgical History  Procedure Laterality Date  . Abdominal hysterectomy    . Tubal ligation    . Cyst excision      chest    Social History   Social History  . Marital Status: Single    Spouse Name: N/A  . Number of Children: N/A  . Years of Education: N/A   Occupational History  . Not on file.   Social History Main Topics  . Smoking status: Former Smoker    Quit date: 10/25/1997  . Smokeless tobacco: Never Used  . Alcohol Use: No  . Drug Use: No  . Sexual Activity: Not on file   Other Topics Concern  . Not on file   Social History Narrative    Current Outpatient Prescriptions on File Prior to Visit  Medication Sig Dispense Refill  . amLODipine (NORVASC) 2.5 MG tablet Take 1 tablet (2.5 mg total) by mouth daily. 30 tablet 11  . aspirin EC 81 MG tablet Take 81 mg by mouth daily.     . calcium carbonate 1250 MG capsule Take 1,250 mg by mouth daily.      . clotrimazole-betamethasone (LOTRISONE) cream Apply 1 application topically 3 (three) times daily. As needed for rash 90 g 5  . ibuprofen (ADVIL,MOTRIN) 200 MG tablet Take 400 mg by mouth every 6 (six) hours as needed. Pain    . lactulose (CHRONULAC) 10 GM/15ML solution Take 45 mLs (30 g total) by mouth 3 (three) times daily. 500 mL 2  . losartan-hydrochlorothiazide (HYZAAR) 100-25 MG tablet TAKE ONE TABLET BY MOUTH ONCE DAILY 30 tablet 0  . metFORMIN (GLUCOPHAGE-XR) 500 MG  24 hr tablet TAKE ONE TABLET BY MOUTH ONCE DAILY WITH BREAKFAST 30 tablet 0  . metroNIDAZOLE (METROGEL VAGINAL) 0.75 % vaginal gel Place 1 Applicatorful vaginally at bedtime. At bedtime for 5 nights. 70 g 0  . simvastatin (ZOCOR) 40 MG tablet TAKE ONE TABLET BY MOUTH AT BEDTIME 30 tablet 0   No current facility-administered medications on file prior to visit.    No Known Allergies  Family History  Problem Relation Age of Onset  . Breast cancer Mother   . Nephrolithiasis Mother   . Hypertension Father   . Colon cancer Neg Hx     BP 152/99 mmHg  Pulse 99  Temp(Src) 98 F (36.7 C) (Oral)  Ht  (1.575 m)  Wt 135 lb (61.236 kg)  BMI 24.69 kg/m2  SpO2 95%  Review of Systems  Constitutional: Negative for unexpected weight change.  HENT: Negative for hearing loss.   Eyes: Negative for visual disturbance.  Respiratory: Negative for shortness of breath.   Cardiovascular: Negative for chest pain.  Gastrointestinal: Negative for anal bleeding.  Endocrine: Negative for cold intolerance.  Genitourinary: Negative for hematuria.  Musculoskeletal: Positive for back pain.  Skin: Negative for rash.  Allergic/Immunologic:  Positive for environmental allergies.  Neurological: Negative for syncope and numbness.  Hematological: Bruises/bleeds easily.  Psychiatric/Behavioral: Negative for dysphoric mood.       Objective:   Physical Exam VS: see vs page GEN: no distress HEAD: head: no deformity eyes: no periorbital swelling, no proptosis external nose and ears are normal mouth: no lesion seen NECK: supple, thyroid is not enlarged CHEST WALL: no deformity LUNGS:  Clear to auscultation CV: reg rate and rhythm, no murmur ABD: abdomen is soft.  Slight LLQ tenderness.  no hepatosplenomegaly.  not distended.  no hernia.   MUSCULOSKELETAL: muscle bulk and strength are grossly normal.  no obvious joint swelling.  gait is normal and steady EXTEMITIES: no deformity.  no ulcer on the feet.   feet are of normal color and temp.  no edema PULSES: dorsalis pedis intact bilat.  no carotid bruit NEURO:  cn 2-12 grossly intact.   readily moves all 4's.  sensation is intact to touch on the feet SKIN:  Normal texture and temperature.  No rash or suspicious lesion is visible.   NODES:  None palpable at the neck PSYCH: alert, well-oriented.  Does not appear anxious nor depressed.     Assessment & Plan:  Wellness visit today, with problems stable, except as noted.   SEPARATE EVALUATION FOLLOWS--EACH PROBLEM HERE IS NEW, NOT RESPONDING TO TREATMENT, OR POSES SIGNIFICANT RISK TO THE PATIENT'S HEALTH: HISTORY OF THE PRESENT ILLNESS: Pt states few days of slight LLQ pain, but no assoc dysuria She takes BP meds intermittently.  PAST MEDICAL HISTORY reviewed and up to date today REVIEW OF SYSTEMS: Foot rash persists.  Denies fever PHYSICAL EXAMINATION: VITAL SIGNS:  See vs page GENERAL: no distress Pulses: dorsalis pedis intact bilat.   MSK: no deformity of the feet CV: no leg edema Skin:  no ulcer on the feet, but there is severe patchy eczema of the left foot, with assoc hyperpigmentation.  normal color and temp on the feet. Neuro: sensation is intact to touch on the feet LAB/XRAY RESULTS: i personally reviewed electrocardiogram tracing (today): Indication: HTN Impression: normal IMPRESSION: LLQ pain, new, uncertain etiology HTN: therapy limited by noncompliance.  i'll do the best i can. Heat rash, persistent PLAN: check labs.

## 2016-01-14 NOTE — Patient Instructions (Addendum)
blood and urine tests are requested for you today.  We'll let you know about the results.  If the pain persists, please let me know, so we can check an ultrasound.  It is really important to never miss the blood pressure pills.  i have sent a prescription to your pharmacy, for a skin cream.   please consider these measures for your health:  minimize alcohol.  do not use tobacco products.  have a colonoscopy at least every 10 years from age 63.  Women should have an annual mammogram from age 740.  keep firearms safely stored.  always use seat belts.  have working smoke alarms in your home.  see an eye doctor and dentist regularly.  never drive under the influence of alcohol or drugs (including prescription drugs).   it is critically important to prevent falling down (keep floor areas well-lit, dry, and free of loose objects.  If you have a cane, walker, or wheelchair, you should use it, even for short trips around the house.  Wear flat-soled shoes.  Also, try not to rush).   Let's check a CT scan.  you will receive a phone call, about a day and time for an appointment.   Please return in 1 year.

## 2016-01-14 NOTE — Progress Notes (Signed)
we discussed code status.  pt requests full code, but would not want to be started or maintained on artificial life-support measures if there was not a reasonable chance of recovery 

## 2016-01-15 LAB — HIV ANTIBODY (ROUTINE TESTING W REFLEX): HIV 1&2 Ab, 4th Generation: NONREACTIVE

## 2016-01-20 ENCOUNTER — Other Ambulatory Visit: Payer: Self-pay | Admitting: Acute Care

## 2016-01-20 ENCOUNTER — Telehealth: Payer: Self-pay | Admitting: Acute Care

## 2016-01-21 NOTE — Telephone Encounter (Signed)
Called patient to discuss lung cancer screening program. Pt does not qualify for the program as she does not meet the smoking pack years requirement for the program. Will inform Dr. George HughEllison's office. Pt is also aware she does not qualify.

## 2016-01-23 ENCOUNTER — Other Ambulatory Visit: Payer: Self-pay | Admitting: Obstetrics and Gynecology

## 2016-01-23 ENCOUNTER — Other Ambulatory Visit: Payer: Self-pay

## 2016-01-23 DIAGNOSIS — Z1231 Encounter for screening mammogram for malignant neoplasm of breast: Secondary | ICD-10-CM

## 2016-01-28 ENCOUNTER — Ambulatory Visit (INDEPENDENT_AMBULATORY_CARE_PROVIDER_SITE_OTHER)
Admission: RE | Admit: 2016-01-28 | Discharge: 2016-01-28 | Disposition: A | Discharge status: Home / Self Care | Payer: Self-pay | Source: Ambulatory Visit | Attending: Endocrinology | Admitting: Endocrinology

## 2016-01-28 DIAGNOSIS — N951 Menopausal and female climacteric states: Secondary | ICD-10-CM

## 2016-02-02 ENCOUNTER — Other Ambulatory Visit: Payer: Self-pay | Admitting: Endocrinology

## 2016-02-12 ENCOUNTER — Ambulatory Visit
Admission: RE | Admit: 2016-02-12 | Discharge: 2016-02-12 | Disposition: A | Discharge status: Home / Self Care | Payer: BLUE CROSS/BLUE SHIELD | Source: Ambulatory Visit

## 2016-02-12 DIAGNOSIS — Z1231 Encounter for screening mammogram for malignant neoplasm of breast: Secondary | ICD-10-CM

## 2016-03-14 ENCOUNTER — Other Ambulatory Visit: Payer: Self-pay | Admitting: Endocrinology

## 2016-04-15 ENCOUNTER — Other Ambulatory Visit: Payer: Self-pay | Admitting: Endocrinology

## 2016-08-06 ENCOUNTER — Ambulatory Visit (HOSPITAL_COMMUNITY)
Admission: EM | Admit: 2016-08-06 | Discharge: 2016-08-06 | Disposition: A | Discharge status: Home / Self Care | Payer: BLUE CROSS/BLUE SHIELD | Attending: Physician Assistant | Admitting: Physician Assistant

## 2016-08-06 ENCOUNTER — Encounter (HOSPITAL_COMMUNITY): Payer: Self-pay | Admitting: Emergency Medicine

## 2016-08-06 DIAGNOSIS — K047 Periapical abscess without sinus: Secondary | ICD-10-CM

## 2016-08-06 DIAGNOSIS — I10 Essential (primary) hypertension: Secondary | ICD-10-CM

## 2016-08-06 DIAGNOSIS — K0889 Other specified disorders of teeth and supporting structures: Secondary | ICD-10-CM

## 2016-08-06 MED ORDER — AMOXICILLIN 500 MG PO CAPS: 500.0000 mg | ORAL_CAPSULE | Freq: Two times a day (BID) | ORAL | 0 refills | Status: DC

## 2016-08-06 NOTE — ED Triage Notes (Signed)
[facility] with a complaint of oral swelling and pain secondary to a possible dental abscess that has been present for 3 weeks.

## 2016-08-06 NOTE — Discharge Instructions (Signed)
You did not have anything to open that was visible. So treat with Antibiotic therapy. Certainly if you worsen or have high fevers, worsening pain then go to the ED or here if needed.

## 2016-08-06 NOTE — ED Provider Notes (Signed)
CSN: [id number]     Arrival date & time 08/06/16  1206 History   First MD Initiated Contact with Patient 08/06/16 1354     Chief Complaint  Patient presents with  . Dental Pain   (Consider location/radiation/quality/duration/timing/severity/associated sxs/prior Treatment) Very pleasant 65 yo presents with left lower gum and tooth pain. Ongoing x 3 weeks. She states a filling fell out some time ago and feels that it may be infected. No fever or chills.     Past Medical History:  Diagnosis Date  . Diabetes mellitus without complication (HCC)    type 2  . GERD   . HYPERCHOLESTEROLEMIA   . Hyperglycemia   . HYPERTENSION   . Menopause   . Non-cardiogenic pulmonary edema   . RENAL CYST, LEFT   . SPONDYLOSIS UNSPEC SITE W/O MENTION MYELOPATHY    Past Surgical History:  Procedure Laterality Date  . ABDOMINAL HYSTERECTOMY    . CYST EXCISION     chest  . TUBAL LIGATION     Family History  Problem Relation Age of Onset  . Breast cancer Mother   . Nephrolithiasis Mother   . Hypertension Father   . Colon cancer Neg Hx    Social History  Substance Use Topics  . Smoking status: Former Smoker    Quit date: 10/25/1997  . Smokeless tobacco: Never Used  . Alcohol use No   OB History    No data available     Review of Systems  Constitutional: Negative for chills and fever.  HENT: Positive for dental problem. Negative for facial swelling.   Skin: Negative for wound.    Allergies  Review of patient's allergies indicates no known allergies.  Home Medications   Prior to Admission medications   Medication Sig Start Date End Date Taking? Authorizing Provider  amLODipine (NORVASC) 2.5 MG tablet Take 1 tablet (2.5 mg total) by mouth daily. 07/09/14  Yes Romero BellingSean Ellison, MD  metFORMIN (GLUCOPHAGE-XR) 500 MG 24 hr tablet TAKE ONE TABLET BY MOUTH ONCE DAILY WITH BREAKFAST 12/03/15  Yes Romero BellingSean Ellison, MD  simvastatin (ZOCOR) 40 MG tablet TAKE ONE TABLET BY MOUTH AT BEDTIME 04/16/16  Yes Romero BellingSean  Ellison, MD  amoxicillin (AMOXIL) 500 MG capsule Take 1 capsule (500 mg total) by mouth 2 (two) times daily. 08/06/16   Riki SheerMichelle G Lasharon Dunivan, PA-C  aspirin EC 81 MG tablet Take 81 mg by mouth daily.     Historical Provider, MD  calcium carbonate 1250 MG capsule Take 1,250 mg by mouth daily.      Historical Provider, MD  clotrimazole-betamethasone (LOTRISONE) cream Apply 1 application topically 3 (three) times daily. As needed for rash 12/24/14   Romero BellingSean Ellison, MD  ibuprofen (ADVIL,MOTRIN) 200 MG tablet Take 400 mg by mouth every 6 (six) hours as needed. Pain    Historical Provider, MD  lactulose (CHRONULAC) 10 GM/15ML solution Take 45 mLs (30 g total) by mouth 3 (three) times daily. 05/08/14   Romero BellingSean Ellison, MD  losartan-hydrochlorothiazide (HYZAAR) 100-25 MG tablet TAKE ONE TABLET BY MOUTH ONCE DAILY 01/12/16   Romero BellingSean Ellison, MD  losartan-hydrochlorothiazide (HYZAAR) 100-25 MG tablet TAKE ONE TABLET BY MOUTH ONCE DAILY 04/16/16   Romero BellingSean Ellison, MD  metFORMIN (GLUCOPHAGE-XR) 500 MG 24 hr tablet TAKE ONE TABLET BY MOUTH ONCE DAILY WITH  BREAKFAST 04/16/16   Romero BellingSean Ellison, MD  metroNIDAZOLE (METROGEL VAGINAL) 0.75 % vaginal gel Place 1 Applicatorful vaginally at bedtime. At bedtime for 5 nights. 04/08/14   Linna HoffJames D Kindl, MD  triamcinolone cream (KENALOG)  0.1 % Apply 1 application topically 4 (four) times daily. As needed for rash 01/14/16   Romero Belling, MD   Meds Ordered and Administered this Visit  Medications - No data to display  BP (!) 172/101 (BP Location: Left Arm) Comment: notified rn  Pulse 78   Temp 98.2 F (36.8 C) (Oral)   Resp 16   SpO2 100%  No data found.   Physical Exam  Constitutional: She appears well-developed and well-nourished. No distress.  HENT:  Head: Normocephalic and atraumatic.  Mouth/Throat: Oropharynx is clear and moist.  Mild erythema and tenderness to palpation of left lower gum, broken left lower molar, no frank abscess  Neurological: She is alert.  Skin: Skin is warm and  dry. She is not diaphoretic.  Psychiatric: Her behavior is normal.  Nursing note and vitals reviewed.   Urgent Care Course   Clinical Course    Procedures (including critical care time)  Labs Review Labs Reviewed - No data to display  Imaging Review No results found.   Visual Acuity Review  Right Eye Distance:   Left Eye Distance:   Bilateral Distance:    Right Eye Near:   Left Eye Near:    Bilateral Near:         MDM   1. Dental infection   2. Pain, dental   3. Essential hypertension    No frank abscess or signs of a systemic infection. Treat with antibiotics. May continue with OTC regimens for pain. If worsens or becomes ill then f/u here or the ED. Try to have good f/u with Dentist when possible.  3. Increased BP in setting of pain, She will go home and take meds. Asymptomatic.     Riki Sheer, PA-C 08/06/16 1414

## 2016-11-29 ENCOUNTER — Telehealth: Payer: Self-pay | Admitting: Endocrinology

## 2016-11-29 NOTE — Telephone Encounter (Signed)
Pt called in and said that she would like a TB Test and a Flu Shot done and would like to know if she could come in and have both of those done here.

## 2016-11-29 NOTE — Telephone Encounter (Signed)
I contacted the patient and advised we could do the TB and the Flu shot together. Patient scheduled for 11/30/2016.

## 2016-11-30 ENCOUNTER — Ambulatory Visit (INDEPENDENT_AMBULATORY_CARE_PROVIDER_SITE_OTHER): Payer: Self-pay

## 2016-11-30 DIAGNOSIS — Z23 Encounter for immunization: Secondary | ICD-10-CM

## 2016-11-30 DIAGNOSIS — Z111 Encounter for screening for respiratory tuberculosis: Secondary | ICD-10-CM

## 2016-12-02 LAB — TB SKIN TEST
Induration: NEGATIVE mm
TB Skin Test: NEGATIVE

## 2017-01-13 ENCOUNTER — Encounter: Payer: Self-pay | Admitting: Endocrinology

## 2017-01-17 ENCOUNTER — Encounter: Payer: Self-pay | Admitting: Endocrinology

## 2017-02-07 ENCOUNTER — Encounter: Payer: Self-pay | Admitting: Endocrinology

## 2017-03-17 ENCOUNTER — Ambulatory Visit (INDEPENDENT_AMBULATORY_CARE_PROVIDER_SITE_OTHER): Payer: Self-pay

## 2017-03-17 ENCOUNTER — Encounter (HOSPITAL_COMMUNITY): Payer: Self-pay | Admitting: Family Medicine

## 2017-03-17 ENCOUNTER — Ambulatory Visit (HOSPITAL_COMMUNITY)
Admission: EM | Admit: 2017-03-17 | Discharge: 2017-03-17 | Disposition: A | Discharge status: Home / Self Care | Payer: PRIVATE HEALTH INSURANCE | Attending: Family Medicine | Admitting: Family Medicine

## 2017-03-17 DIAGNOSIS — K5901 Slow transit constipation: Secondary | ICD-10-CM | POA: Diagnosis not present

## 2017-03-17 DIAGNOSIS — T148XXA Other injury of unspecified body region, initial encounter: Secondary | ICD-10-CM | POA: Diagnosis not present

## 2017-03-17 DIAGNOSIS — R1032 Left lower quadrant pain: Secondary | ICD-10-CM

## 2017-03-17 DIAGNOSIS — M545 Low back pain, unspecified: Secondary | ICD-10-CM

## 2017-03-17 DIAGNOSIS — R141 Gas pain: Secondary | ICD-10-CM

## 2017-03-17 LAB — POCT URINALYSIS DIP (DEVICE)
BILIRUBIN URINE: NEGATIVE
GLUCOSE, UA: NEGATIVE mg/dL
Ketones, ur: 40 mg/dL — AB
LEUKOCYTES UA: NEGATIVE
NITRITE: NEGATIVE
Protein, ur: NEGATIVE mg/dL
SPECIFIC GRAVITY, URINE: 1.02 (ref 1.005–1.030)
UROBILINOGEN UA: 0.2 mg/dL (ref 0.0–1.0)
pH: 6.5 (ref 5.0–8.0)

## 2017-03-17 NOTE — ED Provider Notes (Signed)
CSN: [id number]     Arrival date & time 03/17/17  1506 History   First MD Initiated Contact with Patient 03/17/17 1557     Chief Complaint  Patient presents with  . Vaginal Discharge  . Flank Pain   (Consider location/radiation/quality/duration/timing/severity/associated sxs/prior Treatment) 66 year old female states that yesterday she developed pain in the left flank. She states she also developed pain in the left lower quadrant. Last night when she went to the bathroom she had small amount of vaginal discharge but has not occurred since that one time. Occasionally she will have transient nausea. Yesterday she had what she believed to be a normal bowel movement but not today. She states she often is constipated and her abdomen will feel similar to the way it does today. The pain in the back is worse when moving in certain positions. It is located in the left lower para thoracic and lumbar musculature.      Past Medical History:  Diagnosis Date  . Diabetes mellitus without complication (HCC)    type 2  . GERD   . HYPERCHOLESTEROLEMIA   . Hyperglycemia   . HYPERTENSION   . Menopause   . Non-cardiogenic pulmonary edema   . RENAL CYST, LEFT   . SPONDYLOSIS UNSPEC SITE W/O MENTION MYELOPATHY    Past Surgical History:  Procedure Laterality Date  . ABDOMINAL HYSTERECTOMY    . CYST EXCISION     chest  . TUBAL LIGATION     Family History  Problem Relation Age of Onset  . Breast cancer Mother   . Nephrolithiasis Mother   . Hypertension Father   . Colon cancer Neg Hx    Social History  Substance Use Topics  . Smoking status: Former Smoker    Quit date: 10/25/1997  . Smokeless tobacco: Never Used  . Alcohol use No   OB History    No data available     Review of Systems  Constitutional: Negative.   HENT: Negative.   Respiratory: Negative.   Gastrointestinal: Positive for abdominal pain, constipation and nausea. Negative for blood in stool.  Genitourinary: Negative.    Musculoskeletal: Positive for back pain and myalgias.  Skin: Negative.   Neurological: Negative.   All other systems reviewed and are negative.   Allergies  Patient has no known allergies.  Home Medications   Prior to Admission medications   Medication Sig Start Date End Date Taking? Authorizing Provider  amLODipine (NORVASC) 2.5 MG tablet Take 1 tablet (2.5 mg total) by mouth daily. 07/09/14   Romero BellingEllison, Sean, MD  amoxicillin (AMOXIL) 500 MG capsule Take 1 capsule (500 mg total) by mouth 2 (two) times daily. 08/06/16   Riki SheerYoung, Michelle G, PA-C  aspirin EC 81 MG tablet Take 81 mg by mouth daily.     [provider]  calcium carbonate 1250 MG capsule Take 1,250 mg by mouth daily.      [provider]  clotrimazole-betamethasone (LOTRISONE) cream Apply 1 application topically 3 (three) times daily. As needed for rash 12/24/14   Romero BellingEllison, Sean, MD  ibuprofen (ADVIL,MOTRIN) 200 MG tablet Take 400 mg by mouth every 6 (six) hours as needed. Pain    [provider]  lactulose (CHRONULAC) 10 GM/15ML solution Take 45 mLs (30 g total) by mouth 3 (three) times daily. 05/08/14   Romero BellingEllison, Sean, MD  losartan-hydrochlorothiazide [facility](HYZAAR) 100-25 MG tablet TAKE ONE TABLET BY MOUTH ONCE DAILY 01/12/16   Romero BellingEllison, Sean, MD  losartan-hydrochlorothiazide (HYZAAR) 100-25 MG tablet TAKE ONE TABLET BY  MOUTH ONCE DAILY 04/16/16   Romero Belling, MD  metFORMIN (GLUCOPHAGE-XR) 500 MG 24 hr tablet TAKE ONE TABLET BY MOUTH ONCE DAILY WITH BREAKFAST 12/03/15   Romero Belling, MD  metFORMIN (GLUCOPHAGE-XR) 500 MG 24 hr tablet TAKE ONE TABLET BY MOUTH ONCE DAILY WITH  BREAKFAST 04/16/16   Romero Belling, MD  metroNIDAZOLE (METROGEL VAGINAL) 0.75 % vaginal gel Place 1 Applicatorful vaginally at bedtime. At bedtime for 5 nights. 04/08/14   Linna Hoff, MD  simvastatin (ZOCOR) 40 MG tablet TAKE ONE TABLET BY MOUTH AT BEDTIME 04/16/16   Romero Belling, MD  triamcinolone cream (KENALOG) 0.1 % Apply 1 application  topically 4 (four) times daily. As needed for rash 01/14/16   Romero Belling, MD   Meds Ordered and Administered this Visit  Medications - No data to display  BP (!) 196/108   Pulse 87   Temp 99.5 F (37.5 C) (Oral)   Resp 16   SpO2 100%  No data found.   Physical Exam  Constitutional: She is oriented to person, place, and time. She appears well-developed and well-nourished. No distress.  Neck: Neck supple.  Cardiovascular: Normal rate.   Pulmonary/Chest: Effort normal and breath sounds normal.  Abdominal: Soft. Bowel sounds are normal. She exhibits no distension. There is tenderness.  Small area in the left lower quadrant with marked tenderness associated with guarding. No other areas of tenderness.  Musculoskeletal: She exhibits no edema.  The left lower parathoracic and upper paralumbar musculature very tender. Patient is placed in various positions of leaning forward, to the right and left. These movements and positioning reproduces the pain. Palpation of this area also reproduces the pain for which she presents. There is pain along the left lateral and anterior costal margin.  Neurological: She is alert and oriented to person, place, and time.  Skin: Skin is warm and dry.  Psychiatric: She has a normal mood and affect.  Nursing note and vitals reviewed.   Urgent Care Course     Procedures (including critical care time)  Labs Review Labs Reviewed  POCT URINALYSIS DIP (DEVICE) - Abnormal; Notable for the following:       Result Value   Ketones, ur 40 (*)    Hgb urine dipstick TRACE (*)    All other components within normal limits    Imaging Review Dg Abd 2 Views  Result Date: 03/17/2017 CLINICAL DATA:  Lower abdominal pain and left lower quadrant tenderness EXAM: ABDOMEN - 2 VIEW COMPARISON:  Abdominal CT 03/07/2009 FINDINGS: The bowel gas pattern is normal. There is no evidence of free air. No radio-opaque calculi or other significant radiographic abnormality is seen.  IMPRESSION: Normal abdominal radiographs Electronically Signed   By: Deatra Robinson M.D.   On: 03/17/2017 16:42     Visual Acuity Review  Right Eye Distance:   Left Eye Distance:   Bilateral Distance:    Right Eye Near:   Left Eye Near:    Bilateral Near:         MDM   1. Acute left-sided low back pain without sciatica   2. Muscle strain   3. Left lower quadrant pain   4. Slow transit constipation   5. Abdominal gas pain    Back pain is due to muscle pain. He may have strained her back in some way. Apply heat to the area and continue your ibuprofen. You have abdominal gas pain. The x-ray showed that you have a large amount of gas and stool and air:.  Recommend using MiraLAX a couple of glasses every 6 hours until you have a good bowel movement. Return if needed otherwise follow-up with your primary care doctor. Have your blood pressure rechecked, may need a change in treatment     Hayden Rasmussen, NP 03/17/17 1700    Hayden Rasmussen, NP 03/17/17 1706

## 2017-03-17 NOTE — ED Triage Notes (Signed)
Pt here for left side pain and LLQ pain. sts started yesterday. sts some vaginal discharge but she is not sexually active. Denies urinary complaints. LBM- this am but not normal. sts hx of constipation.

## 2017-03-17 NOTE — Discharge Instructions (Addendum)
Back pain is due to muscle pain. He may have strained her back in some way. Apply heat to the area and continue your ibuprofen. You have abdominal gas pain. The x-ray showed that you have a large amount of gas and stool and air:. Recommend using MiraLAX a couple of glasses every 6 hours until you have a good bowel movement. Return if needed otherwise follow-up with your primary care doctor.Have your blood pressure rechecked, may need a change in treatment

## 2017-05-13 ENCOUNTER — Telehealth: Payer: Self-pay | Admitting: Endocrinology

## 2017-05-13 NOTE — Telephone Encounter (Signed)
Ov would be needed to consider.  Needs f/u ov next available.

## 2017-05-13 NOTE — Telephone Encounter (Signed)
Patient called in needing a RX filled for her nerves. Please call patient and advise.   [facility] [address] - [place] ([address]), [place] - [address] [phone] (Phone) [phone] (Fax)

## 2017-05-16 ENCOUNTER — Ambulatory Visit (INDEPENDENT_AMBULATORY_CARE_PROVIDER_SITE_OTHER): Payer: PRIVATE HEALTH INSURANCE | Admitting: Endocrinology

## 2017-05-16 ENCOUNTER — Encounter: Payer: Self-pay | Admitting: Endocrinology

## 2017-05-16 VITALS — BP 150/102 | HR 76 | Wt 121.0 lb

## 2017-05-16 DIAGNOSIS — E119 Type 2 diabetes mellitus without complications: Secondary | ICD-10-CM

## 2017-05-16 LAB — BASIC METABOLIC PANEL
BUN: 16 mg/dL (ref 6–23)
CO2: 28 mEq/L (ref 19–32)
Calcium: 10.2 mg/dL (ref 8.4–10.5)
Chloride: 101 mEq/L (ref 96–112)
Creatinine, Ser: 0.6 mg/dL (ref 0.40–1.20)
GFR: 129.52 mL/min (ref >60.00)
Glucose, Bld: 87 mg/dL (ref 70–99)
POTASSIUM: 3.9 meq/L (ref 3.5–5.1)
SODIUM: 136 meq/L (ref 135–145)

## 2017-05-16 LAB — LIPID PANEL
CHOLESTEROL: 227 mg/dL — AB (ref 0–200)
HDL: 87.1 mg/dL (ref >39.00)
LDL Cholesterol: 130 mg/dL — ABNORMAL HIGH (ref 0–99)
NonHDL: 140.24
Total CHOL/HDL Ratio: 3
Triglycerides: 49 mg/dL (ref 0.0–149.0)
VLDL: 9.8 mg/dL (ref 0.0–40.0)

## 2017-05-16 LAB — HEMOGLOBIN A1C: HEMOGLOBIN A1C: 5.6 % (ref 4.6–6.5)

## 2017-05-16 LAB — TSH: TSH: 0.71 u[IU]/mL (ref 0.35–4.50)

## 2017-05-16 MED ORDER — ATORVASTATIN CALCIUM 20 MG PO TABS: 10.0000 mg | ORAL_TABLET | Freq: Every day | ORAL | 1 refills | Status: DC

## 2017-05-16 MED ORDER — LOSARTAN POTASSIUM-HCTZ 50-12.5 MG PO TABS: 1.0000 | ORAL_TABLET | Freq: Every day | ORAL | 3 refills | Status: DC

## 2017-05-16 NOTE — Progress Notes (Signed)
Subjective:    Patient ID: Danielle Patterson, female    DOB: 11/03/1950, 66 y.o.   MRN: [id number]  HPI Pt return for f/u of multiple med probs.  she has no insurance.  Denies chest pain or sob.   Past Medical History:  Diagnosis Date  . Diabetes mellitus without complication (HCC)    type 2  . GERD   . HYPERCHOLESTEROLEMIA   . Hyperglycemia   . HYPERTENSION   . Menopause   . Non-cardiogenic pulmonary edema   . RENAL CYST, LEFT   . SPONDYLOSIS UNSPEC SITE W/O MENTION MYELOPATHY     Past Surgical History:  Procedure Laterality Date  . ABDOMINAL HYSTERECTOMY    . CYST EXCISION     chest  . TUBAL LIGATION      Social History   Social History  . Marital status: Single    Spouse name: N/A  . Number of children: N/A  . Years of education: N/A   Occupational History  . Not on file.   Social History Main Topics  . Smoking status: Former Smoker    Quit date: 10/25/1997  . Smokeless tobacco: Never Used  . Alcohol use No  . Drug use: No  . Sexual activity: Not on file   Other Topics Concern  . Not on file   Social History Narrative  . No narrative on file    Current Outpatient Prescriptions on File Prior to Visit  Medication Sig Dispense Refill  . aspirin EC 81 MG tablet Take 81 mg by mouth daily.     . calcium carbonate 1250 MG capsule Take 1,250 mg by mouth daily.      . clotrimazole-betamethasone (LOTRISONE) cream Apply 1 application topically 3 (three) times daily. As needed for rash (Patient not taking: Reported on 05/16/2017) 90 g 5  . ibuprofen (ADVIL,MOTRIN) 200 MG tablet Take 400 mg by mouth every 6 (six) hours as needed. Pain    . lactulose (CHRONULAC) 10 GM/15ML solution Take 45 mLs (30 g total) by mouth 3 (three) times daily. (Patient not taking: Reported on 05/16/2017) 500 mL 2  . metroNIDAZOLE (METROGEL VAGINAL) 0.75 % vaginal gel Place 1 Applicatorful vaginally at bedtime. At bedtime for 5 nights. (Patient not taking: Reported on 05/16/2017) 70 g 0  .  triamcinolone cream (KENALOG) 0.1 % Apply 1 application topically 4 (four) times daily. As needed for rash (Patient not taking: Reported on 05/16/2017) 80 g 3   No current facility-administered medications on file prior to visit.     No Known Allergies  Family History  Problem Relation Age of Onset  . Breast cancer Mother   . Nephrolithiasis Mother   . Hypertension Father   . Colon cancer Neg Hx     BP (!) 150/102   Pulse 76   Wt 121 lb (54.9 kg)   SpO2 98%   BMI 22.13 kg/m    Review of Systems She has lost weight.      Objective:   Physical Exam VITAL SIGNS:  See vs page GENERAL: no distress Pulses: foot pulses are intact bilaterally.   MSK: no deformity of the feet or ankles.  CV: no edema of the legs or ankles Skin:  no ulcer on the feet or ankles.  normal color and temp on the feet and ankles Neuro: sensation is intact to touch on the feet and ankles.       Assessment & Plan:  HTN: worse DM: due for recheck Dyslipidemia: due for recheck  Patient Instructions  [facility], to resume losartan-HCTZ blood tests are requested for you today.  We'll let you know about the results. Based on the results,i'll also prescribe generic lipitor, and metformin if needed.  Please have a blood pressure check here in 1 month.  Please call a few days ahead, to schedule. I realize you do not have health insurance, but there are many other services you need for your health.  These include screening tests for colon, prostate and/or breast cancer; electrocardiogram, and others.  Please let me know if you want to get these important tests done.   Please come back for a follow-up appointment in 6 months.

## 2017-05-16 NOTE — Telephone Encounter (Signed)
Patient notified via voicemail to schedule an appointment.

## 2017-05-16 NOTE — Patient Instructions (Addendum)
I have sent a prescription [facility], to resume losartan-HCTZ blood tests are requested for you today.  We'll let you know about the results. Based on the results,i'll also prescribe generic lipitor, and metformin if needed.  Please have a blood pressure check here in 1 month.  Please call a few days ahead, to schedule. I realize you do not have health insurance, but there are many other services you need for your health.  These include screening tests for colon, prostate and/or breast cancer; electrocardiogram, and others.  Please let me know if you want to get these important tests done.   Please come back for a follow-up appointment in 6 months.

## 2017-05-17 NOTE — Progress Notes (Signed)
Called and notified patient. [facility] I also told her about [facility].

## 2017-11-16 ENCOUNTER — Ambulatory Visit: Payer: Self-pay | Admitting: Endocrinology

## 2018-02-24 ENCOUNTER — Emergency Department (HOSPITAL_COMMUNITY)
Admission: EM | Admit: 2018-02-24 | Discharge: 2018-02-24 | Disposition: A | Discharge status: Home / Self Care | Payer: Self-pay | Attending: Emergency Medicine | Admitting: Emergency Medicine

## 2018-02-24 ENCOUNTER — Encounter (HOSPITAL_COMMUNITY): Payer: Self-pay | Admitting: *Deleted

## 2018-02-24 DIAGNOSIS — I10 Essential (primary) hypertension: Secondary | ICD-10-CM | POA: Insufficient documentation

## 2018-02-24 DIAGNOSIS — E119 Type 2 diabetes mellitus without complications: Secondary | ICD-10-CM | POA: Insufficient documentation

## 2018-02-24 DIAGNOSIS — Z87891 Personal history of nicotine dependence: Secondary | ICD-10-CM | POA: Insufficient documentation

## 2018-02-24 DIAGNOSIS — R42 Dizziness and giddiness: Secondary | ICD-10-CM | POA: Insufficient documentation

## 2018-02-24 DIAGNOSIS — Z79899 Other long term (current) drug therapy: Secondary | ICD-10-CM | POA: Insufficient documentation

## 2018-02-24 LAB — COMPREHENSIVE METABOLIC PANEL
ALBUMIN: 4.9 g/dL (ref 3.5–5.0)
ALT: 21 U/L (ref 14–54)
AST: 19 U/L (ref 15–41)
Alkaline Phosphatase: 137 U/L — ABNORMAL HIGH (ref 38–126)
Anion gap: 9 (ref 5–15)
BUN: 17 mg/dL (ref 6–20)
CHLORIDE: 105 mmol/L (ref 101–111)
CO2: 27 mmol/L (ref 22–32)
CREATININE: 0.63 mg/dL (ref 0.44–1.00)
Calcium: 10.3 mg/dL (ref 8.9–10.3)
GFR calc Af Amer: >60 mL/min (ref >60)
GFR calc non Af Amer: >60 mL/min (ref >60)
GLUCOSE: 97 mg/dL (ref 65–99)
Potassium: 4 mmol/L (ref 3.5–5.1)
SODIUM: 141 mmol/L (ref 135–145)
Total Bilirubin: 1.4 mg/dL — ABNORMAL HIGH (ref 0.3–1.2)
Total Protein: 8.5 g/dL — ABNORMAL HIGH (ref 6.5–8.1)

## 2018-02-24 LAB — CBC WITH DIFFERENTIAL/PLATELET
BASOS ABS: 0 10*3/uL (ref 0.0–0.1)
BASOS PCT: 0 %
EOS ABS: 0.1 10*3/uL (ref 0.0–0.7)
EOS PCT: 1 %
HCT: 45 % (ref 36.0–46.0)
Hemoglobin: 15.2 g/dL — ABNORMAL HIGH (ref 12.0–15.0)
LYMPHS PCT: 21 %
Lymphs Abs: 1 10*3/uL (ref 0.7–4.0)
MCH: 27.7 pg (ref 26.0–34.0)
MCHC: 33.8 g/dL (ref 30.0–36.0)
MCV: 82.1 fL (ref 78.0–100.0)
Monocytes Absolute: 0.4 10*3/uL (ref 0.1–1.0)
Monocytes Relative: 9 %
Neutro Abs: 3.3 10*3/uL (ref 1.7–7.7)
Neutrophils Relative %: 69 %
PLATELETS: 216 10*3/uL (ref 150–400)
RBC: 5.48 MIL/uL — ABNORMAL HIGH (ref 3.87–5.11)
RDW: 14.4 % (ref 11.5–15.5)
WBC: 4.9 10*3/uL (ref 4.0–10.5)

## 2018-02-24 LAB — TROPONIN I: Troponin I: <0.03 ng/mL (ref <0.03)

## 2018-02-24 MED ORDER — LOSARTAN POTASSIUM-HCTZ 50-12.5 MG PO TABS: 1.0000 | ORAL_TABLET | Freq: Every day | ORAL | Status: DC

## 2018-02-24 MED ORDER — LORAZEPAM 2 MG/ML IJ SOLN
0.5000 mg | Freq: Once | INTRAMUSCULAR | Status: AC
  Administered 2018-02-24: 0.5 mg via INTRAVENOUS
  Filled 2018-02-24: qty 1

## 2018-02-24 MED ORDER — LISINOPRIL-HYDROCHLOROTHIAZIDE 20-25 MG PO TABS: 1.0000 | ORAL_TABLET | Freq: Every day | ORAL | 0 refills | Status: DC

## 2018-02-24 MED ORDER — HYDRALAZINE HCL 20 MG/ML IJ SOLN: 5.0000 mg | Freq: Once | INTRAMUSCULAR | Status: DC

## 2018-02-24 MED ORDER — HYDROCHLOROTHIAZIDE 12.5 MG PO CAPS
12.5000 mg | ORAL_CAPSULE | Freq: Every day | ORAL | Status: DC
  Administered 2018-02-24: 12.5 mg via ORAL
  Filled 2018-02-24: qty 1

## 2018-02-24 MED ORDER — LOSARTAN POTASSIUM 50 MG PO TABS
50.0000 mg | ORAL_TABLET | Freq: Every day | ORAL | Status: DC
  Administered 2018-02-24: 50 mg via ORAL
  Filled 2018-02-24: qty 1

## 2018-02-24 MED ORDER — SODIUM CHLORIDE 0.9 % IV SOLN
INTRAVENOUS | Status: DC
  Administered 2018-02-24: 08:00:00 via INTRAVENOUS

## 2018-02-24 NOTE — ED Triage Notes (Signed)
Pt stated "I've got a lot going on.  My daughter is causing me a problem.  I was feeling lightheaded.  I've been having diarrhea; it started yesterday."

## 2018-02-24 NOTE — ED Provider Notes (Signed)
[facility]-EMERGENCY DEPT Provider Note   CSN: [id number] Arrival date & time: 02/24/18  0539     History   Chief Complaint Chief Complaint  Patient presents with  . Hypertension  . Dizziness    HPI Danielle Patterson is a 67 y.o. female.  67 year old female presents with dizziness that began this morning.  Patient felt that she was going to pass out.  Did not have any prodromal chest pain or shortness of breath or headache.  Took her blood pressure at home and it was elevated.  Does have a history of hypertension and has not used her antihypertensives for quite some time.  Also has had increasing stress recently.  Notes watery diarrhea without recent travel or antibiotic use.  No fever or chills.  No emesis noted.  Symptoms persistent and no treatment used with them prior to arrival.     Past Medical History:  Diagnosis Date  . Diabetes mellitus without complication (HCC)    type 2  . GERD   . HYPERCHOLESTEROLEMIA   . Hyperglycemia   . HYPERTENSION   . Menopause   . Non-cardiogenic pulmonary edema   . RENAL CYST, LEFT   . SPONDYLOSIS UNSPEC SITE W/O MENTION MYELOPATHY     Patient Active Problem List   Diagnosis Date Noted  . Menopausal state 01/14/2016  . Thyroid function study abnormality 12/24/2014  . Smoker 12/24/2014  . Routine general medical examination at a health care facility 08/25/2013  . Diabetes (HCC) 08/24/2013  . Encounter for long-term (current) use of other medications 11/03/2012  . RENAL CYST, LEFT 03/11/2009  . 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 ABDOMINAL PAIN, LOWER 10/21/2008  . COUGH 07/25/2008  . HYPERCHOLESTEROLEMIA 10/24/2007  . ASYMPTOMATIC POSTMENOPAUSAL STATUS 10/24/2007  . Essential hypertension 05/25/2007  . GERD 05/25/2007    Past Surgical History:  Procedure Laterality Date  . ABDOMINAL HYSTERECTOMY    . CYST EXCISION     chest  . TUBAL LIGATION       OB History   None       Home Medications    Prior to Admission medications   Medication Sig Start Date End Date Taking? Authorizing Provider  aspirin 325 MG EC tablet Take 325 mg by mouth every 6 (six) hours as needed for pain.   Yes [provider]  diphenhydrAMINE (BENADRYL) 25 mg capsule Take 25 mg by mouth every 6 (six) hours as needed for itching.   Yes [provider]  lisinopril-hydrochlorothiazide (PRINZIDE,ZESTORETIC) 20-25 MG tablet Take 1 tablet by mouth daily.   Yes [provider]  Multiple Vitamin (MULTIVITAMIN WITH MINERALS) TABS tablet Take 1 tablet by mouth daily.   Yes [provider]  clotrimazole-betamethasone (LOTRISONE) cream Apply 1 application topically 3 (three) times daily. As needed for rash Patient not taking: Reported on 05/16/2017 12/24/14   Romero Belling, MD  lactulose (CHRONULAC) 10 GM/15ML solution Take 45 mLs (30 g total) by mouth 3 (three) times daily. Patient not taking: Reported on 05/16/2017 05/08/14   Romero Belling, MD  losartan-hydrochlorothiazide [facility]) 50-12.5 MG tablet Take 1 tablet by mouth daily. 05/16/17   Romero Belling, MD  metroNIDAZOLE (METROGEL VAGINAL) 0.75 % vaginal gel Place 1 Applicatorful vaginally at bedtime. At bedtime for 5 nights. Patient not taking: Reported on 05/16/2017 04/08/14   Linna Hoff, MD  triamcinolone cream (KENALOG) 0.1 % Apply 1 application topically 4 (four) times daily. As needed for rash Patient not taking: Reported on 05/16/2017  01/14/16   Romero Belling, MD    Family History Family History  Problem Relation Age of Onset  . Breast cancer Mother   . Nephrolithiasis Mother   . Hypertension Father   . Colon cancer Neg Hx     Social History Social History   Tobacco Use  . Smoking status: Former Smoker    Last attempt to quit: 10/25/1997    Years since quitting: 20.3  . Smokeless tobacco: Never Used  Substance Use Topics  . Alcohol use: No  . Drug use: No     Allergies   Patient has no  known allergies.   Review of Systems Review of Systems  All other systems reviewed and are negative.    Physical Exam Updated Vital Signs BP (!) 181/116   Pulse 95   Temp 97.6 F (36.4 C) (Oral)   Resp 20   Ht 1.575 m ( )   Wt 54.4 kg (120 lb)   SpO2 100%   BMI 21.95 kg/m   Physical Exam  Constitutional: She is oriented to person, place, and time. She appears well-developed and well-nourished.  Non-toxic appearance. No distress.  HENT:  Head: Normocephalic and atraumatic.  Eyes: Pupils are equal, round, and reactive to light. Conjunctivae, EOM and lids are normal.  Neck: Normal range of motion. Neck supple. No tracheal deviation present. No thyroid mass present.  Cardiovascular: Normal rate, regular rhythm and normal heart sounds. Exam reveals no gallop.  No murmur heard. Pulmonary/Chest: Effort normal and breath sounds normal. No stridor. No respiratory distress. She has no decreased breath sounds. She has no wheezes. She has no rhonchi. She has no rales.  Abdominal: Soft. Normal appearance and bowel sounds are normal. She exhibits no distension. There is no tenderness. There is no rebound and no CVA tenderness.  Musculoskeletal: Normal range of motion. She exhibits no edema or tenderness.  Neurological: She is alert and oriented to person, place, and time. She has normal strength. No cranial nerve deficit or sensory deficit. GCS eye subscore is 4. GCS verbal subscore is 5. GCS motor subscore is 6.  Skin: Skin is warm and dry. No abrasion and no rash noted.  Psychiatric: She has a normal mood and affect. Her speech is normal and behavior is normal.  Nursing note and vitals reviewed.    ED Treatments / Results  Labs (all labs ordered are listed, but only abnormal results are displayed) Labs Reviewed  CBC WITH DIFFERENTIAL/PLATELET  COMPREHENSIVE METABOLIC PANEL  TROPONIN I    EKG None  Radiology No results found.  Procedures Procedures (including critical  care time)  Medications Ordered in ED Medications  0.9 %  sodium chloride infusion (has no administration in time range)  losartan-hydrochlorothiazide (HYZAAR) 50-12.5 MG per tablet 1 tablet (has no administration in time range)     Initial Impression / Assessment and Plan / ED Course  I have reviewed the triage vital signs and the nursing notes.  Pertinent labs & imaging results that were available during my care of the patient were reviewed by me and considered in my medical decision making (see chart for details).     Blood pressure improved after being medicated.  No evidence of renal problems at this time.  Will re-prescribe her antihypertensives and discharged home  Final Clinical Impressions(s) / ED Diagnoses   Final diagnoses:  None    ED Discharge Orders    None       Lorre Nick, MD 02/24/18 [phone]

## 2018-03-04 ENCOUNTER — Observation Stay (HOSPITAL_COMMUNITY)
Admission: EM | Admit: 2018-03-04 | Discharge: 2018-03-05 | Disposition: A | Discharge status: Home / Self Care | Payer: Self-pay | Attending: Internal Medicine | Admitting: Internal Medicine

## 2018-03-04 ENCOUNTER — Other Ambulatory Visit: Payer: Self-pay

## 2018-03-04 ENCOUNTER — Observation Stay (HOSPITAL_COMMUNITY): Payer: Self-pay

## 2018-03-04 ENCOUNTER — Emergency Department (HOSPITAL_COMMUNITY): Payer: Self-pay

## 2018-03-04 ENCOUNTER — Encounter (HOSPITAL_COMMUNITY): Payer: Self-pay | Admitting: Emergency Medicine

## 2018-03-04 DIAGNOSIS — Z87891 Personal history of nicotine dependence: Secondary | ICD-10-CM | POA: Insufficient documentation

## 2018-03-04 DIAGNOSIS — I160 Hypertensive urgency: Secondary | ICD-10-CM | POA: Insufficient documentation

## 2018-03-04 DIAGNOSIS — E7800 Pure hypercholesterolemia, unspecified: Secondary | ICD-10-CM | POA: Insufficient documentation

## 2018-03-04 DIAGNOSIS — Z79899 Other long term (current) drug therapy: Secondary | ICD-10-CM | POA: Insufficient documentation

## 2018-03-04 DIAGNOSIS — I088 Other rheumatic multiple valve diseases: Secondary | ICD-10-CM | POA: Insufficient documentation

## 2018-03-04 DIAGNOSIS — N281 Cyst of kidney, acquired: Secondary | ICD-10-CM | POA: Insufficient documentation

## 2018-03-04 DIAGNOSIS — E119 Type 2 diabetes mellitus without complications: Secondary | ICD-10-CM | POA: Insufficient documentation

## 2018-03-04 DIAGNOSIS — R0602 Shortness of breath: Secondary | ICD-10-CM | POA: Insufficient documentation

## 2018-03-04 DIAGNOSIS — R51 Headache: Secondary | ICD-10-CM | POA: Insufficient documentation

## 2018-03-04 DIAGNOSIS — R000 Tachycardia, unspecified: Secondary | ICD-10-CM | POA: Insufficient documentation

## 2018-03-04 DIAGNOSIS — R110 Nausea: Secondary | ICD-10-CM | POA: Insufficient documentation

## 2018-03-04 DIAGNOSIS — R0789 Other chest pain: Principal | ICD-10-CM | POA: Insufficient documentation

## 2018-03-04 DIAGNOSIS — Z7982 Long term (current) use of aspirin: Secondary | ICD-10-CM | POA: Insufficient documentation

## 2018-03-04 DIAGNOSIS — R079 Chest pain, unspecified: Secondary | ICD-10-CM

## 2018-03-04 DIAGNOSIS — E785 Hyperlipidemia, unspecified: Secondary | ICD-10-CM | POA: Insufficient documentation

## 2018-03-04 DIAGNOSIS — K219 Gastro-esophageal reflux disease without esophagitis: Secondary | ICD-10-CM | POA: Insufficient documentation

## 2018-03-04 DIAGNOSIS — Z23 Encounter for immunization: Secondary | ICD-10-CM | POA: Insufficient documentation

## 2018-03-04 DIAGNOSIS — I10 Essential (primary) hypertension: Secondary | ICD-10-CM | POA: Insufficient documentation

## 2018-03-04 LAB — CBC
HEMATOCRIT: 43.7 % (ref 36.0–46.0)
Hemoglobin: 14.2 g/dL (ref 12.0–15.0)
MCH: 26.6 pg (ref 26.0–34.0)
MCHC: 32.5 g/dL (ref 30.0–36.0)
MCV: 81.8 fL (ref 78.0–100.0)
Platelets: 184 10*3/uL (ref 150–400)
RBC: 5.34 MIL/uL — ABNORMAL HIGH (ref 3.87–5.11)
RDW: 14.5 % (ref 11.5–15.5)
WBC: 4.9 10*3/uL (ref 4.0–10.5)

## 2018-03-04 LAB — LIPID PANEL
CHOL/HDL RATIO: 2.7 ratio
Cholesterol: 252 mg/dL — ABNORMAL HIGH (ref 0–200)
HDL: 93 mg/dL (ref >40)
LDL CALC: 151 mg/dL — AB (ref 0–99)
TRIGLYCERIDES: 39 mg/dL (ref <150)
VLDL: 8 mg/dL (ref 0–40)

## 2018-03-04 LAB — GLUCOSE, CAPILLARY
GLUCOSE-CAPILLARY: 98 mg/dL (ref 65–99)
GLUCOSE-CAPILLARY: 94 mg/dL (ref 65–99)
Glucose-Capillary: 79 mg/dL (ref 65–99)

## 2018-03-04 LAB — BASIC METABOLIC PANEL
ANION GAP: 13 (ref 5–15)
BUN: 18 mg/dL (ref 6–20)
CHLORIDE: 99 mmol/L — AB (ref 101–111)
CO2: 24 mmol/L (ref 22–32)
CREATININE: 0.79 mg/dL (ref 0.44–1.00)
Calcium: 9.6 mg/dL (ref 8.9–10.3)
GFR calc non Af Amer: >60 mL/min (ref >60)
Glucose, Bld: 125 mg/dL — ABNORMAL HIGH (ref 65–99)
POTASSIUM: 3.1 mmol/L — AB (ref 3.5–5.1)
SODIUM: 136 mmol/L (ref 135–145)

## 2018-03-04 LAB — TROPONIN I
Troponin I: <0.03 ng/mL (ref <0.03)
Troponin I: <0.03 ng/mL (ref <0.03)

## 2018-03-04 LAB — I-STAT TROPONIN, ED
TROPONIN I, POC: 0.01 ng/mL (ref 0.00–0.08)
Troponin i, poc: 0.01 ng/mL (ref 0.00–0.08)

## 2018-03-04 LAB — HIV ANTIBODY (ROUTINE TESTING W REFLEX): HIV SCREEN 4TH GENERATION: NONREACTIVE

## 2018-03-04 MED ORDER — ONDANSETRON HCL 4 MG/2ML IJ SOLN: 4.0000 mg | Freq: Four times a day (QID) | INTRAMUSCULAR | Status: DC | PRN

## 2018-03-04 MED ORDER — LISINOPRIL 20 MG PO TABS
20.0000 mg | ORAL_TABLET | Freq: Every day | ORAL | Status: DC
  Administered 2018-03-04 – 2018-03-05 (×2): 20 mg via ORAL
  Filled 2018-03-04 (×2): qty 1

## 2018-03-04 MED ORDER — HYDRALAZINE HCL 20 MG/ML IJ SOLN
10.0000 mg | INTRAMUSCULAR | Status: DC | PRN
  Administered 2018-03-04 (×2): 10 mg via INTRAVENOUS
  Filled 2018-03-04 (×2): qty 1

## 2018-03-04 MED ORDER — ATORVASTATIN CALCIUM 40 MG PO TABS
80.0000 mg | ORAL_TABLET | Freq: Every day | ORAL | Status: DC
  Administered 2018-03-04: 80 mg via ORAL
  Filled 2018-03-04: qty 2

## 2018-03-04 MED ORDER — LISINOPRIL-HYDROCHLOROTHIAZIDE 20-25 MG PO TABS: 1.0000 | ORAL_TABLET | Freq: Every day | ORAL | Status: DC

## 2018-03-04 MED ORDER — ACETAMINOPHEN 325 MG PO TABS
650.0000 mg | ORAL_TABLET | Freq: Once | ORAL | Status: AC
  Administered 2018-03-04: 650 mg via ORAL
  Filled 2018-03-04: qty 2

## 2018-03-04 MED ORDER — ASPIRIN 81 MG PO CHEW
324.0000 mg | CHEWABLE_TABLET | Freq: Once | ORAL | Status: AC
  Administered 2018-03-04: 324 mg via ORAL

## 2018-03-04 MED ORDER — ENOXAPARIN SODIUM 40 MG/0.4ML ~~LOC~~ SOLN
40.0000 mg | SUBCUTANEOUS | Status: DC
  Administered 2018-03-04 – 2018-03-05 (×2): 40 mg via SUBCUTANEOUS
  Filled 2018-03-04 (×2): qty 0.4

## 2018-03-04 MED ORDER — MORPHINE SULFATE (PF) 2 MG/ML IV SOLN: 2.0000 mg | INTRAVENOUS | Status: DC | PRN

## 2018-03-04 MED ORDER — ASPIRIN 81 MG PO CHEW
CHEWABLE_TABLET | ORAL | Status: AC
  Filled 2018-03-04: qty 4

## 2018-03-04 MED ORDER — INSULIN ASPART 100 UNIT/ML ~~LOC~~ SOLN: 0.0000 [IU] | Freq: Three times a day (TID) | SUBCUTANEOUS | Status: DC

## 2018-03-04 MED ORDER — NITROGLYCERIN 0.4 MG SL SUBL: 0.4000 mg | SUBLINGUAL_TABLET | SUBLINGUAL | Status: DC | PRN

## 2018-03-04 MED ORDER — HYDROCHLOROTHIAZIDE 25 MG PO TABS
25.0000 mg | ORAL_TABLET | Freq: Every day | ORAL | Status: DC
  Administered 2018-03-04 – 2018-03-05 (×2): 25 mg via ORAL
  Filled 2018-03-04 (×2): qty 1

## 2018-03-04 MED ORDER — AMLODIPINE BESYLATE 5 MG PO TABS: 10.0000 mg | ORAL_TABLET | Freq: Every day | ORAL | Status: DC

## 2018-03-04 MED ORDER — GI COCKTAIL ~~LOC~~: 30.0000 mL | Freq: Four times a day (QID) | ORAL | Status: DC | PRN

## 2018-03-04 MED ORDER — POTASSIUM CHLORIDE CRYS ER 20 MEQ PO TBCR
40.0000 meq | EXTENDED_RELEASE_TABLET | ORAL | Status: AC
  Administered 2018-03-04: 40 meq via ORAL
  Filled 2018-03-04: qty 2

## 2018-03-04 MED ORDER — PNEUMOCOCCAL VAC POLYVALENT 25 MCG/0.5ML IJ INJ
0.5000 mL | INJECTION | INTRAMUSCULAR | Status: AC
  Administered 2018-03-05: 0.5 mL via INTRAMUSCULAR
  Filled 2018-03-04: qty 0.5

## 2018-03-04 MED ORDER — ACETAMINOPHEN 325 MG PO TABS
650.0000 mg | ORAL_TABLET | ORAL | Status: DC | PRN
  Administered 2018-03-04 (×2): 650 mg via ORAL
  Filled 2018-03-04 (×2): qty 2

## 2018-03-04 NOTE — H&P (Signed)
History and Physical    Danielle Patterson:[id number] DOB: Aug 10, 1951 DOA: 03/04/2018  Referring MD/NP/PA: Elpidio Anis, PA-C PCP: Romero Belling, MD  Patient coming from: Home  Chief Complaint: Chest pain  I have personally briefly reviewed patient's old medical records in [facility]   HPI: Danielle Patterson is a 67 y.o. female with medical history significant of HTN, HLD, DM type II, and GERD; who presents with complaints of left substernal chest pain.  Symptoms reportedly woke the patient out of her sleep with associated symptoms of lightheaded as though she may pass out, headache, shortness of breath, and nausea.  Patient reports being under a lot of social stressors.  She reports that she is actually intermittently had sharp pains underneath her left breast that lasts only a few seconds over the last week or so.  Also recalls having an another episode with similar symptoms where she felt her vision gets blurry is that she could not see.  Denies having any significant swelling of her legs, recent travel, focal weakness,  fall, or loss of consciousness.    Patient had been seen in the emergency department on 5/3, for issues with blood pressure and was started of lisinopril/hydrochlorothiazide.  She has been taking the medication since that time, but reports that blood pressures still have been elevated.   ED Course: Upon admission into the emergency department patient was noted to be afebrile, pulse 65-110,, respirations 11-20, blood pressure reportedly as high as 212/144 -151/101, and O2 saturation maintained on room air.  Labs revealed potassium of 3.1, troponin negative x2.  EKG showed no significant ischemic changes.  Review of Systems  Constitutional: Positive for malaise/fatigue. Negative for chills and fever.  HENT: Positive for congestion. Negative for hearing loss.   Eyes: Positive for blurred vision. Negative for pain.  Respiratory: Positive for shortness of breath.  Negative for cough.   Cardiovascular: Positive for chest pain. Negative for leg swelling.  Gastrointestinal: Positive for nausea. Negative for blood in stool and vomiting.  Genitourinary: Negative for dysuria and frequency.  Musculoskeletal: Negative for falls and neck pain.  Skin: Negative for rash.  Neurological: Positive for dizziness and headaches. Negative for focal weakness and loss of consciousness.  Endo/Heme/Allergies: Positive for environmental allergies.  Psychiatric/Behavioral: Negative for memory loss and suicidal ideas.    Past Medical History:  Diagnosis Date  . Diabetes mellitus without complication (HCC)    type 2  . GERD   . HYPERCHOLESTEROLEMIA   . Hyperglycemia   . HYPERTENSION   . Menopause   . Non-cardiogenic pulmonary edema   . RENAL CYST, LEFT   . SPONDYLOSIS UNSPEC SITE W/O MENTION MYELOPATHY     Past Surgical History:  Procedure Laterality Date  . ABDOMINAL HYSTERECTOMY    . CYST EXCISION     chest  . TUBAL LIGATION       reports that she quit smoking about 20 years ago. She has never used smokeless tobacco. She reports that she does not drink alcohol or use drugs.  No Known Allergies  Family History  Problem Relation Age of Onset  . Breast cancer Mother   . Nephrolithiasis Mother   . Hypertension Father   . Colon cancer Neg Hx     Prior to Admission medications   Medication Sig Start Date End Date Taking? Authorizing Provider  aspirin 325 MG EC tablet Take 325 mg by mouth every 6 (six) hours as needed for pain.   Yes [provider]  diphenhydrAMINE (  BENADRYL) 25 mg capsule Take 25 mg by mouth every 6 (six) hours as needed for itching.   Yes [provider]  lisinopril-hydrochlorothiazide (PRINZIDE,ZESTORETIC) 20-25 MG tablet Take 1 tablet by mouth daily. 02/24/18  Yes Lorre Nick, MD  Multiple Vitamin (MULTIVITAMIN WITH MINERALS) TABS tablet Take 1 tablet by mouth daily.   Yes [provider]    clotrimazole-betamethasone (LOTRISONE) cream Apply 1 application topically 3 (three) times daily. As needed for rash Patient not taking: Reported on 05/16/2017 12/24/14   Romero Belling, MD  lactulose (CHRONULAC) 10 GM/15ML solution Take 45 mLs (30 g total) by mouth 3 (three) times daily. Patient not taking: Reported on 05/16/2017 05/08/14   Romero Belling, MD  losartan-hydrochlorothiazide [facility]) 50-12.5 MG tablet Take 1 tablet by mouth daily. Patient not taking: Reported on 03/04/2018 05/16/17   Romero Belling, MD  triamcinolone cream (KENALOG) 0.1 % Apply 1 application topically 4 (four) times daily. As needed for rash Patient not taking: Reported on 05/16/2017 01/14/16   Romero Belling, MD    Physical Exam:  Constitutional: NAD, calm, comfortable Vitals:   03/04/18 0046 03/04/18 0330 03/04/18 0355 03/04/18 0455  BP:  (!) 168/105 (S) (!) 160/94 (!) 151/101  Pulse:   (S) 66 65  Resp:  11 (S) 18 18  Temp:      TempSrc:      SpO2:  99% (S) 100% 100%  Weight: 54.4 kg (120 lb)     Height:  (1.575 m)      Eyes: PERRL, lids and conjunctivae normal ENMT: Mucous membranes are moist. Posterior pharynx clear of any exudate or lesions.Normal dentition.  Neck: normal, supple, no masses, no thyromegaly Respiratory: clear to auscultation bilaterally, no wheezing, no crackles. Normal respiratory effort. No accessory muscle use.  Cardiovascular: Regular rate and rhythm, no murmurs / rubs / gallops. No extremity edema. 2+ pedal pulses. No carotid bruits.  Symptoms not reproducible with palpation. Abdomen: no tenderness, no masses palpated. No hepatosplenomegaly. Bowel sounds positive.  Musculoskeletal: no clubbing / cyanosis. No joint deformity upper and lower extremities. Good ROM, no contractures. Normal muscle tone.  Skin: no rashes, lesions, ulcers. No induration Neurologic: CN 2-12 grossly intact. Sensation intact, DTR normal. Strength 5/5 in all 4.  Psychiatric: Normal judgment and insight. Alert  and oriented x 3. Normal mood.     Labs on Admission: I have personally reviewed following labs and imaging studies  CBC: Recent Labs  Lab 03/04/18 0037  WBC 4.9  HGB 14.2  HCT 43.7  MCV 81.8  PLT 184   Basic Metabolic Panel: Recent Labs  Lab 03/04/18 0037  NA 136  K 3.1*  CL 99*  CO2 24  GLUCOSE 125*  BUN 18  CREATININE 0.79  CALCIUM 9.6   GFR: Estimated Creatinine Clearance: 56.2 mL/min (by C-G formula based on SCr of 0.79 mg/dL). Liver Function Tests: No results for input(s): AST, ALT, ALKPHOS, BILITOT, PROT, ALBUMIN in the last 168 hours. No results for input(s): LIPASE, AMYLASE in the last 168 hours. No results for input(s): AMMONIA in the last 168 hours. Coagulation Profile: No results for input(s): INR, PROTIME in the last 168 hours. Cardiac Enzymes: No results for input(s): CKTOTAL, CKMB, CKMBINDEX, TROPONINI in the last 168 hours. BNP (last 3 results) No results for input(s): PROBNP in the last 8760 hours. HbA1C: No results for input(s): HGBA1C in the last 72 hours. CBG: No results for input(s): GLUCAP in the last 168 hours. Lipid Profile: No results for input(s): CHOL, HDL, LDLCALC,  TRIG, CHOLHDL, LDLDIRECT in the last 72 hours. Thyroid Function Tests: No results for input(s): TSH, T4TOTAL, FREET4, T3FREE, THYROIDAB in the last 72 hours. Anemia Panel: No results for input(s): VITAMINB12, FOLATE, FERRITIN, TIBC, IRON, RETICCTPCT in the last 72 hours. Urine analysis:    Component Value Date/Time   COLORURINE YELLOW 01/14/2016 1042   APPEARANCEUR CLEAR 01/14/2016 1042   LABSPEC 1.020 03/17/2017 1606   PHURINE 6.5 03/17/2017 1606   GLUCOSEU NEGATIVE 03/17/2017 1606   GLUCOSEU NEGATIVE 01/14/2016 1042   HGBUR TRACE (A) 03/17/2017 1606   BILIRUBINUR NEGATIVE 03/17/2017 1606   KETONESUR 40 (A) 03/17/2017 1606   PROTEINUR NEGATIVE 03/17/2017 1606   UROBILINOGEN 0.2 03/17/2017 1606   NITRITE NEGATIVE 03/17/2017 1606   LEUKOCYTESUR NEGATIVE 03/17/2017  1606   Sepsis Labs: No results found for this or any previous visit (from the past 240 hour(s)).   Radiological Exams on Admission: Dg Chest 2 View  Result Date: 03/04/2018 CLINICAL DATA:  67 year old female with chest pain. EXAM: CHEST - 2 VIEW COMPARISON:  Chest radiograph dated 03/15/2011 FINDINGS: The lungs are clear. There is no pleural effusion or pneumothorax. The cardiac silhouette is within normal limits. The aorta is tortuous. No acute osseous pathology. Punctate radiopaque foci in the soft tissues superior to the right clavicle similar to prior radiograph. There is degenerative changes of the spine. No acute osseous pathology. IMPRESSION: No active cardiopulmonary disease. Electronically Signed   By: Elgie Collard M.D.   On: 03/04/2018 01:06    EKG: Independently reviewed.  Sinus tachycardia 105 bpm with biatrial enlargement and 1st degree heart  Assessment/Plan Chest pain: Patient since with complaints of chest pain waking her out of her sleep.  Heart score approximately 4.  Initial troponin and EKG negative for significant signs of ischemia.  Patient currently chest pain-free. - Admit to a telemetry bed - Chest pain order set initiated - Trend cardiac troponin - Check echocardiogram for chest pain - Will need to consult cardiology for need of stress testing  Hypertensive urgency: Patient reports starting back taking blood pressure medications of lisinopril hydrochlorothiazide on 5/3.  Patient systolic blood pressures noted to be as high as the 200s on admission.  Patient with complaints of headache likely related with blood pressure. - Continue lisinopril-hydrochlorothiazide - Consider adding on additional medication for better control of blood pressure - Hydralazine IV prn   Hyperlipidemia - Follow-up lipid panel  History of diabetes mellitus type 2: Patient diet-controlled not on any medications.  DVT prophylaxis: lovenox  Code Status: full  Family Communication: No  family present at bedside Disposition Plan: Discharge home if work-up negative Consults called: None  Admission status: observation  Clydie Braun MD [facility] Pager [phone]   If 7PM-7AM, please contact night-coverage [email or web address] Password [email or web address]  03/04/2018, 5:13 AM

## 2018-03-04 NOTE — Progress Notes (Signed)
  PROGRESS NOTE  Patient admitted earlier this morning. See H&P. Danielle Patterson is a 67 y.o. female with medical history significant of HTN, HLD, DM type II, and GERD; who presents with complaints of left substernal chest pain.  Symptoms reportedly woke the patient out of her sleep with associated symptoms of lightheaded as though she may pass out, headache, shortness of breath, and nausea.  Patient reports being under a lot of social stressors.  She reports that she is actually intermittently had sharp pains underneath her left breast that lasts only a few seconds over the last week or so. She was recently started on lisinopril/hydrochlorothiazide in the emergency department in May 3 due to elevated blood pressures.  Upon presentation to the emergency room on 5/11, her blood pressure was as high as 212/144.  EKG was completed which did not reveal significant ischemic changes.  Troponin was negative.  On examination, chest pain has now resolved.  Blood pressure has been improved.  Troponin x4 have been negative.  LDL is elevated at 151.  Echocardiogram is pending.  She does have significant family history of sister who died of heart attack in her 23s.  She admits that she has been under significant stress, her son passed away less than a year ago.  Case discussed with Dr. Diona Browner of cardiology.  Reasonable to obtain echocardiogram.  If this is unremarkable for wall motion abnormalities, patient may be discharged home with outpatient cardiology referral.   Noralee Stain, DO [facility] [email or web address] Password [facility] 03/04/2018, 12:45 PM

## 2018-03-04 NOTE — ED Provider Notes (Signed)
[facility]-EMERGENCY DEPT Provider Note   CSN: [id number] Arrival date & time: 03/04/18  0019     History   Chief Complaint Chief Complaint  Patient presents with  . Chest Pain    HPI Danielle Patterson is a 67 y.o. female.  Patient with a history of DM, HTN, HLD, GERD, presents with complaint of chest pain that woke her from sleep tonight. She had associated SOB. The symptoms have been intermittent. She denies vomiting or diaphoresis. She does not report previous similar pain. No recent fever, cough, illness. She denies any symptoms during the day yesterday.   The history is provided by the patient. No language interpreter was used.    Past Medical History:  Diagnosis Date  . Diabetes mellitus without complication (HCC)    type 2  . GERD   . HYPERCHOLESTEROLEMIA   . Hyperglycemia   . HYPERTENSION   . Menopause   . Non-cardiogenic pulmonary edema   . RENAL CYST, LEFT   . SPONDYLOSIS UNSPEC SITE W/O MENTION MYELOPATHY     Patient Active Problem List   Diagnosis Date Noted  . Menopausal state 01/14/2016  . Thyroid function study abnormality 12/24/2014  . Smoker 12/24/2014  . Routine general medical examination at a health care facility 08/25/2013  . Diabetes (HCC) 08/24/2013  . Encounter for long-term (current) use of other medications 11/03/2012  . RENAL CYST, LEFT 03/11/2009  . 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 ABDOMINAL PAIN, LOWER 10/21/2008  . COUGH 07/25/2008  . HYPERCHOLESTEROLEMIA 10/24/2007  . ASYMPTOMATIC POSTMENOPAUSAL STATUS 10/24/2007  . Essential hypertension 05/25/2007  . GERD 05/25/2007    Past Surgical History:  Procedure Laterality Date  . ABDOMINAL HYSTERECTOMY    . CYST EXCISION     chest  . TUBAL LIGATION       OB History   None      Home Medications    Prior to Admission medications   Medication Sig Start Date End Date Taking? Authorizing Provider  aspirin 325 MG EC tablet Take  325 mg by mouth every 6 (six) hours as needed for pain.   Yes [provider]  diphenhydrAMINE (BENADRYL) 25 mg capsule Take 25 mg by mouth every 6 (six) hours as needed for itching.   Yes [provider]  lisinopril-hydrochlorothiazide (PRINZIDE,ZESTORETIC) 20-25 MG tablet Take 1 tablet by mouth daily. 02/24/18  Yes Lorre Nick, MD  Multiple Vitamin (MULTIVITAMIN WITH MINERALS) TABS tablet Take 1 tablet by mouth daily.   Yes [provider]  clotrimazole-betamethasone (LOTRISONE) cream Apply 1 application topically 3 (three) times daily. As needed for rash Patient not taking: Reported on 05/16/2017 12/24/14   Romero Belling, MD  lactulose (CHRONULAC) 10 GM/15ML solution Take 45 mLs (30 g total) by mouth 3 (three) times daily. Patient not taking: Reported on 05/16/2017 05/08/14   Romero Belling, MD  losartan-hydrochlorothiazide [facility]) 50-12.5 MG tablet Take 1 tablet by mouth daily. Patient not taking: Reported on 03/04/2018 05/16/17   Romero Belling, MD  triamcinolone cream (KENALOG) 0.1 % Apply 1 application topically 4 (four) times daily. As needed for rash Patient not taking: Reported on 05/16/2017 01/14/16   Romero Belling, MD    Family History Family History  Problem Relation Age of Onset  . Breast cancer Mother   . Nephrolithiasis Mother   . Hypertension Father   . Colon cancer Neg Hx     Social History Social History   Tobacco Use  . Smoking status: Former Smoker  Last attempt to quit: 10/25/1997    Years since quitting: 20.3  . Smokeless tobacco: Never Used  Substance Use Topics  . Alcohol use: No  . Drug use: No     Allergies   Patient has no known allergies.   Review of Systems Review of Systems  Constitutional: Negative for chills and fever.  HENT: Negative.   Respiratory: Positive for shortness of breath.   Cardiovascular: Positive for chest pain.  Gastrointestinal: Negative.  Negative for abdominal pain and nausea.  Musculoskeletal:  Negative.   Skin: Negative.   Neurological: Negative.      Physical Exam Updated Vital Signs BP (S) (!) 160/94 (BP Location: Right Arm)   Pulse (S) 66   Temp 97.7 F (36.5 C) (Oral)   Resp (S) 18   Ht  (1.575 m)   Wt 54.4 kg (120 lb)   SpO2 (S) 100%   BMI 21.95 kg/m   Physical Exam  Constitutional: She is oriented to person, place, and time. She appears well-developed and well-nourished.  HENT:  Head: Normocephalic.  Neck: Normal range of motion. Neck supple.  Cardiovascular: Normal rate, regular rhythm and normal pulses.  No murmur heard. Pulmonary/Chest: Effort normal and breath sounds normal. She has no wheezes. She has no rhonchi. She has no rales.  No chest wall tenderness.   Abdominal: Soft. Bowel sounds are normal. There is no tenderness. There is no rebound and no guarding.  Musculoskeletal: Normal range of motion.       Right lower leg: She exhibits no edema.       Left lower leg: She exhibits no edema.  Neurological: She is alert and oriented to person, place, and time.  Skin: Skin is warm and dry.  Psychiatric: She has a normal mood and affect.  Nursing note and vitals reviewed.    ED Treatments / Results  Labs (all labs ordered are listed, but only abnormal results are displayed) Labs Reviewed  BASIC METABOLIC PANEL - Abnormal; Notable for the following components:      Result Value   Potassium 3.1 (*)    Chloride 99 (*)    Glucose, Bld 125 (*)    All other components within normal limits  CBC - Abnormal; Notable for the following components:   RBC 5.34 (*)    All other components within normal limits  I-STAT TROPONIN, ED  I-STAT TROPONIN, ED    EKG EKG Interpretation  Date/Time:  Saturday Mar 04 2018 00:26:46 EDT Ventricular Rate:  105 PR Interval:    QRS Duration: 77 QT Interval:  341 QTC Calculation: 451 R Axis:   1 Text Interpretation:  Sinus tachycardia Prolonged PR interval LAE, consider biatrial enlargement When compared with  ECG of 02/24/2018, No significant change was found Confirmed by Dione Booze ([id number]) on 03/04/2018 1:18:44 AM   Radiology Dg Chest 2 View  Result Date: 03/04/2018 CLINICAL DATA:  67 year old female with chest pain. EXAM: CHEST - 2 VIEW COMPARISON:  Chest radiograph dated 03/15/2011 FINDINGS: The lungs are clear. There is no pleural effusion or pneumothorax. The cardiac silhouette is within normal limits. The aorta is tortuous. No acute osseous pathology. Punctate radiopaque foci in the soft tissues superior to the right clavicle similar to prior radiograph. There is degenerative changes of the spine. No acute osseous pathology. IMPRESSION: No active cardiopulmonary disease. Electronically Signed   By: Elgie Collard M.D.   On: 03/04/2018 01:06    Procedures Procedures (including critical care time)  Medications Ordered in ED  Medications  acetaminophen (TYLENOL) tablet 650 mg (has no administration in time range)     Initial Impression / Assessment and Plan / ED Course  I have reviewed the triage vital signs and the nursing notes.  Pertinent labs & imaging results that were available during my care of the patient were reviewed by me and considered in my medical decision making (see chart for details).     Patient with a history of cardiac risk factors including DM, HTN, HLD presents with chest pain that woke her from sleep tonight. Pain was associated with SOB. No recent illness, cough, fever.  Patient has a heart score of 4. No previous history of cardiac event. Troponin x 2 are negative.   Discussed with Dr. Rhunette Croft who confirms that risk stratus would warrant provocative testing in the hospital prior to discharge. Discussed with Dr. Katrinka Blazing, [facility], who will admit the patient.     Final Clinical Impressions(s) / ED Diagnoses   Final diagnoses:  None   1. Chest pain  ED Discharge Orders    None       Elpidio Anis, PA-C 03/04/18 0617    Derwood Kaplan, MD 03/04/18  [phone]

## 2018-03-04 NOTE — ED Notes (Signed)
ED TO INPATIENT HANDOFF REPORT  Name/Age/Gender Danielle Patterson 67 y.o. female  Code Status    Code Status Orders  (From admission, onward)        Start     Ordered   03/04/18 0558  Full code  Continuous     03/04/18 0558    Code Status History    This patient has a current code status but no historical code status.      Home/SNF/Other Home  Chief Complaint Heart Problems  Level of Care/Admitting Diagnosis ED Disposition    ED Disposition Condition Comment   Admit  Hospital Area: [facility] [[id number]]  Level of Care: Telemetry [5]  Admit to tele based on following criteria: Complex arrhythmia (Bradycardia/Tachycardia)  Diagnosis: Chest pain [284132]  Admitting Physician: Norval Morton [[id number]]  Attending Physician: Norval Morton [[id number]]  PT Class (Do Not Modify): Observation [104]  PT Acc Code (Do Not Modify): Observation [10022]       Medical History Past Medical History:  Diagnosis Date  . Diabetes mellitus without complication ([place])    type 2  . GERD   . HYPERCHOLESTEROLEMIA   . Hyperglycemia   . HYPERTENSION   . Menopause   . Non-cardiogenic pulmonary edema   . RENAL CYST, LEFT   . SPONDYLOSIS UNSPEC SITE W/O MENTION MYELOPATHY     Allergies No Known Allergies  IV Location/Drains/Wounds Patient Lines/Drains/Airways Status   Active Line/Drains/Airways    Name:   Placement date:   Placement time:   Site:   Days:   Peripheral IV 03/04/18 Right;Posterior Forearm   03/04/18    0522    Forearm   less than 1          Labs/Imaging Results for orders placed or performed during the hospital encounter of 03/04/18 (from the past 48 hour(s))  Basic metabolic panel     Status: Abnormal   Collection Time: 03/04/18 12:37 AM  Result Value Ref Range   Sodium 136 135 - 145 mmol/L   Potassium 3.1 (L) 3.5 - 5.1 mmol/L   Chloride 99 (L) 101 - 111 mmol/L   CO2 24 22 - 32 mmol/L   Glucose, Bld 125 (H) 65 - 99 mg/dL   BUN 18 6  - 20 mg/dL   Creatinine, Ser 0.79 0.44 - 1.00 mg/dL   Calcium 9.6 8.9 - 10.3 mg/dL   GFR calc non Af Amer >60 >60 mL/min   GFR calc Af Amer >60 >60 mL/min    Comment: (NOTE) The eGFR has been calculated using the CKD EPI equation. This calculation has not been validated in all clinical situations. eGFR's persistently <60 mL/min signify possible Chronic Kidney Disease.    Anion gap 13 5 - 15    Comment: Performed at [facility], [place] [address], [place], [place] [address]  CBC     Status: Abnormal   Collection Time: 03/04/18 12:37 AM  Result Value Ref Range   WBC 4.9 4.0 - 10.5 K/uL   RBC 5.34 (H) 3.87 - 5.11 MIL/uL   Hemoglobin 14.2 12.0 - 15.0 g/dL   HCT 43.7 36.0 - 46.0 %   MCV 81.8 78.0 - 100.0 fL   MCH 26.6 26.0 - 34.0 pg   MCHC 32.5 30.0 - 36.0 g/dL   RDW 14.5 11.5 - 15.5 %   Platelets 184 150 - 400 K/uL    Comment: Performed at [facility], [place] [address] [place], [place] [address]  I-stat  troponin, ED     Status: None   Collection Time: 03/04/18 12:43 AM  Result Value Ref Range   Troponin i, poc 0.01 0.00 - 0.08 ng/mL   Comment 3            Comment: Due to the release kinetics of cTnI, a negative result within the first hours of the onset of symptoms does not rule out myocardial infarction with certainty. If myocardial infarction is still suspected, repeat the test at appropriate intervals.   I-Stat Troponin, ED (not at [facility])     Status: None   Collection Time: 03/04/18  4:45 AM  Result Value Ref Range   Troponin i, poc 0.01 0.00 - 0.08 ng/mL   Comment 3            Comment: Due to the release kinetics of cTnI, a negative result within the first hours of the onset of symptoms does not rule out myocardial infarction with certainty. If myocardial infarction is still suspected, repeat the test at appropriate intervals.   Troponin I-serum (0, 3, 6 hours)     Status: None   Collection Time: 03/04/18  6:12 AM  Result Value Ref  Range   Troponin I <0.03 <0.03 ng/mL    Comment: Performed at [facility], [place] [address], [place], [place] [address]  Lipid panel     Status: Abnormal   Collection Time: 03/04/18  6:12 AM  Result Value Ref Range   Cholesterol 252 (H) 0 - 200 mg/dL   Triglycerides 39 <150 mg/dL   HDL 93 >40 mg/dL   Total CHOL/HDL Ratio 2.7 RATIO   VLDL 8 0 - 40 mg/dL   LDL Cholesterol 151 (H) 0 - 99 mg/dL    Comment:        Total Cholesterol/HDL:CHD Risk Coronary Heart Disease Risk Table                     Men   Women  1/2 Average Risk   3.4   3.3  Average Risk       5.0   4.4  2 X Average Risk   9.6   7.1  3 X Average Risk  23.4   11.0        Use the calculated Patient Ratio above and the CHD Risk Table to determine the patient's CHD Risk.        ATP III CLASSIFICATION (LDL):  <100     mg/dL   Optimal  100-129  mg/dL   Near or Above                    Optimal  130-159  mg/dL   Borderline  160-189  mg/dL   High  >190     mg/dL   Very High Performed at [facility] [address], [place], [place] [address]    Dg Chest 2 View  Result Date: 03/04/2018 CLINICAL DATA:  67 year old female with chest pain. EXAM: CHEST - 2 VIEW COMPARISON:  Chest radiograph dated 03/15/2011 FINDINGS: The lungs are clear. There is no pleural effusion or pneumothorax. The cardiac silhouette is within normal limits. The aorta is tortuous. No acute osseous pathology. Punctate radiopaque foci in the soft tissues superior to the right clavicle similar to prior radiograph. There is degenerative changes of the spine. No acute osseous pathology. IMPRESSION: No active cardiopulmonary disease. Electronically Signed   By: Anner Crete M.D.   On: 03/04/2018 01:06  Pending Labs Unresulted Labs (From admission, onward)   Start     Ordered   03/04/18 0558  Troponin I-serum (0, 3, 6 hours)  Now then every 3 hours,   TIMED     03/04/18 0558   03/04/18 0557  HIV antibody (Routine  Testing)  Once,   R     03/04/18 0558      Vitals/Pain Today's Vitals   03/04/18 0535 03/04/18 0630 03/04/18 0700 03/04/18 0712  BP:  (!) 171/107 (!) 166/104 (!) 166/104  Pulse:    65  Resp:  _0 Temp:      TempSrc:      SpO2:  98% 100% 100%  Weight:      Height:      PainSc: 3        Isolation Precautions No active isolations  Medications Medications  hydrALAZINE (APRESOLINE) injection 10 mg (10 mg Intravenous Given 03/04/18 0550)  acetaminophen (TYLENOL) tablet 650 mg (has no administration in time range)  ondansetron (ZOFRAN) injection 4 mg (has no administration in time range)  enoxaparin (LOVENOX) injection 40 mg (has no administration in time range)  gi cocktail (Maalox,Lidocaine,Donnatal) (has no administration in time range)  morphine 2 MG/ML injection 2 mg (has no administration in time range)  nitroGLYCERIN (NITROSTAT) SL tablet 0.4 mg (has no administration in time range)  acetaminophen (TYLENOL) tablet 650 mg (650 mg Oral Given 03/04/18 0437)  aspirin chewable tablet 324 mg (324 mg Oral Given 03/04/18 0528)  potassium chloride SA (K-DUR,KLOR-CON) CR tablet 40 mEq (40 mEq Oral Given 03/04/18 0620)    Mobility walks

## 2018-03-05 ENCOUNTER — Observation Stay (HOSPITAL_BASED_OUTPATIENT_CLINIC_OR_DEPARTMENT_OTHER): Payer: Self-pay

## 2018-03-05 DIAGNOSIS — I361 Nonrheumatic tricuspid (valve) insufficiency: Secondary | ICD-10-CM

## 2018-03-05 LAB — BASIC METABOLIC PANEL
ANION GAP: 12 (ref 5–15)
BUN: 18 mg/dL (ref 6–20)
CALCIUM: 10 mg/dL (ref 8.9–10.3)
CO2: 27 mmol/L (ref 22–32)
CREATININE: 0.8 mg/dL (ref 0.44–1.00)
Chloride: 101 mmol/L (ref 101–111)
GFR calc Af Amer: >60 mL/min (ref >60)
GFR calc non Af Amer: >60 mL/min (ref >60)
GLUCOSE: 117 mg/dL — AB (ref 65–99)
Potassium: 4.1 mmol/L (ref 3.5–5.1)
Sodium: 140 mmol/L (ref 135–145)

## 2018-03-05 LAB — GLUCOSE, CAPILLARY
Glucose-Capillary: 74 mg/dL (ref 65–99)
Glucose-Capillary: 81 mg/dL (ref 65–99)

## 2018-03-05 LAB — ECHOCARDIOGRAM COMPLETE
HEIGHTINCHES: 62 in
Weight: 1887.14 oz

## 2018-03-05 LAB — MAGNESIUM: Magnesium: 2.2 mg/dL (ref 1.7–2.4)

## 2018-03-05 MED ORDER — ATORVASTATIN CALCIUM 80 MG PO TABS: 80.0000 mg | ORAL_TABLET | Freq: Every day | ORAL | 0 refills | Status: DC

## 2018-03-05 NOTE — Progress Notes (Signed)
Pt discharged to home. Dc instructions given with dtr at bedside. No concerns voiced. Pt encouraged to stop by her pharmacy and pick up meds that were e-prescribed by MD. Voiced understanding. Left unit in wheelchair pushed by this writer accompanied by dtr. Left in stable condition.  Derinda Sis.

## 2018-03-05 NOTE — Progress Notes (Signed)
  Echocardiogram 2D Echocardiogram has been performed.  Leta Jungling M 03/05/2018, 9:12 AM

## 2018-03-05 NOTE — Discharge Summary (Signed)
Physician Discharge Summary  [name] RUE:[id number] DOB: 1951/03/18 DOA: 03/04/2018  PCP: Romero Belling, MD  Admit date: 03/04/2018 Discharge date: 03/05/2018  Admitted From: Home Disposition:  Home  Recommendations for Outpatient Follow-up:  1. Follow up with PCP in 1 week 2. Follow up with Cardiology outpatient, sent message to cardiology for patient to be scheduled  Discharge Condition: Stable CODE STATUS: Full  Diet recommendation: Heart healthy   Brief/Interim Summary: Danielle Patterson a 67 y.o.femalewith medical history significant ofHTN,HLD, DM type II, and GERD;who presents with complaints of left substernal chest pain. Symptoms reportedly woke the patient out of her sleep with associated symptoms of lightheaded as though she may pass out, headache, shortness of breath, and nausea. Patient reports being under a lot of social stressors. She reports that she is actually intermittently had sharp pains underneath her left breast that lasts only a few seconds over the last week or so. She was recently started on lisinopril/hydrochlorothiazide in the emergency department in May 3 due to elevated blood pressures.  Upon presentation to the emergency room on 5/11, her blood pressure was as high as 212/144.  EKG was completed which did not reveal significant ischemic changes.  Troponin remained negative x4.  LDL is elevated at 151 and she was started on Lipitor.  Echocardiogram did not reveal any wall motion abnormalities.  Blood pressure remained better controlled with oral antihypertensive.  Case was discussed over the phone with Dr. Diona Browner of cardiology.  As long as echocardiogram remains negative, patient may follow-up as an outpatient.  She remained chest pain-free since admission.  Discharge Diagnoses:  Principal Problem:   Chest pain Active Problems:   HYPERCHOLESTEROLEMIA   Essential hypertension   GERD   Diabetes [facility])   Discharge Instructions  Discharge  Instructions    Call MD for:  difficulty breathing, headache or visual disturbances   Complete by:  As directed    Call MD for:  extreme fatigue   Complete by:  As directed    Call MD for:  persistant dizziness or light-headedness   Complete by:  As directed    Call MD for:  persistant nausea and vomiting   Complete by:  As directed    Call MD for:  severe uncontrolled pain   Complete by:  As directed    Diet - low sodium heart healthy   Complete by:  As directed    Discharge instructions   Complete by:  As directed    You were cared for by a hospitalist during your hospital stay. If you have any questions about your discharge medications or the care you received while you were in the hospital after you are discharged, you can call the unit and ask to speak with the hospitalist on call if the hospitalist that took care of you is not available. Once you are discharged, your primary care physician will handle any further medical issues. Please note that NO REFILLS for any discharge medications will be authorized once you are discharged, as it is imperative that you return to your primary care physician (or establish a relationship with a primary care physician if you do not have one) for your aftercare needs so that they can reassess your need for medications and monitor your lab values.   Increase activity slowly   Complete by:  As directed      Allergies as of 03/05/2018   No Known Allergies     Medication List    TAKE these medications  aspirin 325 MG EC tablet Take 325 mg by mouth every 6 (six) hours as needed for pain.   atorvastatin 80 MG tablet Commonly known as:  LIPITOR Take 1 tablet (80 mg total) by mouth daily at 6 PM.   diphenhydrAMINE 25 mg capsule Commonly known as:  BENADRYL Take 25 mg by mouth every 6 (six) hours as needed for itching.   lisinopril-hydrochlorothiazide 20-25 MG tablet Commonly known as:  PRINZIDE,ZESTORETIC Take 1 tablet by mouth daily.    multivitamin with minerals Tabs tablet Take 1 tablet by mouth daily.      Follow-up Information    Romero Belling, MD. Schedule an appointment as soon as possible for a visit in 1 week(s).   Specialty:  Endocrinology Contact information: [address] [place] [address] [phone]        [facility] Follow up.   Why:  They will call to arrange appointment.  Contact information: [address] [place] [address] [phone]         No Known Allergies  Consultations:  Cardiology over the phone   Procedures/Studies: Dg Chest 2 View  Result Date: 03/04/2018 CLINICAL DATA:  67 year old female with chest pain. EXAM: CHEST - 2 VIEW COMPARISON:  Chest radiograph dated 03/15/2011 FINDINGS: The lungs are clear. There is no pleural effusion or pneumothorax. The cardiac silhouette is within normal limits. The aorta is tortuous. No acute osseous pathology. Punctate radiopaque foci in the soft tissues superior to the right clavicle similar to prior radiograph. There is degenerative changes of the spine. No acute osseous pathology. IMPRESSION: No active cardiopulmonary disease. Electronically Signed   By: Elgie Collard M.D.   On: 03/04/2018 01:06    Echo 5/12 Study Conclusions  - Left ventricle: The cavity size was normal. There was mild focal   basal hypertrophy of the septum. Systolic function was normal.   The estimated ejection fraction was in the range of 60% to 65%.   Wall motion was normal; there were no regional wall motion   abnormalities. Left ventricular diastolic function parameters   were normal. - Atrial septum: No defect or patent foramen ovale was identified. - Pulmonary arteries: PA peak pressure: 33 mm Hg (S).    Discharge Exam: Vitals:   03/05/18 0545 03/05/18 1053  BP: (!) 155/107 (!) 149/103  Pulse: 76 71  Resp: 16   Temp: 98 F (36.7 C)   SpO2: 100%      General: Pt is alert, awake, not in acute distress Cardiovascular: RRR, S1/S2 +, no rubs, no gallops Respiratory: CTA bilaterally, no wheezing, no rhonchi Abdominal: Soft, NT, ND, bowel sounds + Extremities: no edema, no cyanosis    The results of significant diagnostics from this hospitalization (including imaging, microbiology, ancillary and laboratory) are listed below for reference.     Microbiology: No results found for this or any previous visit (from the past 240 hour(s)).   Labs: BNP (last 3 results) No results for input(s): BNP in the last 8760 hours. Basic Metabolic Panel: Recent Labs  Lab 03/04/18 0037 03/05/18 0531  NA 136 140  K 3.1* 4.1  CL 99* 101  CO2 24 27  GLUCOSE 125* 117*  BUN 18 18  CREATININE 0.79 0.80  CALCIUM 9.6 10.0  MG  --  2.2   Liver Function Tests: No results for input(s): AST, ALT, ALKPHOS, BILITOT, PROT, ALBUMIN in the last 168 hours. No results for input(s): LIPASE, AMYLASE in the  last 168 hours. No results for input(s): AMMONIA in the last 168 hours. CBC: Recent Labs  Lab 03/04/18 0037  WBC 4.9  HGB 14.2  HCT 43.7  MCV 81.8  PLT 184   Cardiac Enzymes: Recent Labs  Lab 03/04/18 0612 03/04/18 0959  TROPONINI <0.03 <0.03   BNP: Invalid input(s): POCBNP CBG: Recent Labs  Lab 03/04/18 1343 03/04/18 1644 03/04/18 2120 03/05/18 0749 03/05/18 1204  GLUCAP 79 98 94 74 81   D-Dimer No results for input(s): DDIMER in the last 72 hours. Hgb A1c No results for input(s): HGBA1C in the last 72 hours. Lipid Profile Recent Labs    03/04/18 0612  CHOL 252*  HDL 93  LDLCALC 151*  TRIG 39  CHOLHDL 2.7   Thyroid function studies No results for input(s): TSH, T4TOTAL, T3FREE, THYROIDAB in the last 72 hours.  Invalid input(s): FREET3 Anemia work up No results for input(s): VITAMINB12, FOLATE, FERRITIN, TIBC, IRON, RETICCTPCT in the last 72 hours. Urinalysis    Component Value Date/Time   COLORURINE YELLOW  01/14/2016 1042   APPEARANCEUR CLEAR 01/14/2016 1042   LABSPEC 1.020 03/17/2017 1606   PHURINE 6.5 03/17/2017 1606   GLUCOSEU NEGATIVE 03/17/2017 1606   GLUCOSEU NEGATIVE 01/14/2016 1042   HGBUR TRACE (A) 03/17/2017 1606   BILIRUBINUR NEGATIVE 03/17/2017 1606   KETONESUR 40 (A) 03/17/2017 1606   PROTEINUR NEGATIVE 03/17/2017 1606   UROBILINOGEN 0.2 03/17/2017 1606   NITRITE NEGATIVE 03/17/2017 1606   LEUKOCYTESUR NEGATIVE 03/17/2017 1606   Sepsis Labs Invalid input(s): PROCALCITONIN,  WBC,  LACTICIDVEN Microbiology No results found for this or any previous visit (from the past 240 hour(s)).   Patient was seen and examined on the day of discharge and was found to be in stable condition. Time coordinating discharge: 25 minutes including assessment and coordination of care, as well as examination of the patient.   SIGNED:  Noralee Stain, DO [facility] Pager [phone]  If 7PM-7AM, please contact night-coverage [email or web address] Password [email or web address] 03/05/2018, 1:15 PM

## 2018-11-23 ENCOUNTER — Other Ambulatory Visit: Payer: Self-pay

## 2018-11-23 ENCOUNTER — Encounter: Payer: Self-pay | Admitting: Nurse Practitioner

## 2018-11-23 ENCOUNTER — Ambulatory Visit (INDEPENDENT_AMBULATORY_CARE_PROVIDER_SITE_OTHER): Payer: Medicare Other | Admitting: Nurse Practitioner

## 2018-11-23 VITALS — BP 118/102 | HR 84 | Temp 98.1°F | Ht 60.0 in | Wt 111.8 lb

## 2018-11-23 DIAGNOSIS — Z23 Encounter for immunization: Secondary | ICD-10-CM

## 2018-11-23 DIAGNOSIS — I10 Essential (primary) hypertension: Secondary | ICD-10-CM | POA: Diagnosis not present

## 2018-11-23 DIAGNOSIS — E119 Type 2 diabetes mellitus without complications: Secondary | ICD-10-CM

## 2018-11-23 DIAGNOSIS — E782 Mixed hyperlipidemia: Secondary | ICD-10-CM

## 2018-11-23 LAB — POCT UA - MICROALBUMIN
Albumin/Creatinine Ratio, Urine, POC: 30
Creatinine, POC: 200 mg/dL
Microalbumin Ur, POC: 30 mg/L

## 2018-11-23 LAB — POCT URINALYSIS DIPSTICK
BILIRUBIN UA: NEGATIVE
GLUCOSE UA: NEGATIVE
Ketones, UA: NEGATIVE
Nitrite, UA: NEGATIVE
Protein, UA: NEGATIVE
Spec Grav, UA: 1.025 (ref 1.010–1.025)
Urobilinogen, UA: 0.2 E.U./dL
pH, UA: 6 (ref 5.0–8.0)

## 2018-11-23 MED ORDER — SIMVASTATIN 40 MG PO TABS: 40.0000 mg | ORAL_TABLET | Freq: Every day | ORAL | 0 refills | Status: AC

## 2018-11-23 MED ORDER — LOSARTAN POTASSIUM-HCTZ 100-25 MG PO TABS: 1.0000 | ORAL_TABLET | Freq: Every day | ORAL | 0 refills | Status: DC

## 2018-11-23 NOTE — Progress Notes (Signed)
Subjective:     Patient ID: Danielle Patterson , female    DOB: 08/10/1951 , 68 y.o.   MRN: [id number]   Chief Complaint  Patient presents with  . Establish care  . Hypertension    HPI  Here to establish care - had been seeing Dr. Everardo AllEllison as her PCP.  She had been with for many years.  Originally from [place].  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  Divorced.  She has 3 children - Dgt HTN, knee problems.  Dgt - arthritis, [place] - healthy - vegetarian.     Hypertension  This is a chronic problem. The current episode started more than 1 year ago. The problem is unchanged. The problem is uncontrolled. Pertinent negatives include no anxiety, chest pain, headaches or palpitations.     Past Medical History:  Diagnosis Date  . Diabetes mellitus without complication (HCC)    type 2  . GERD   . HYPERCHOLESTEROLEMIA   . Hyperglycemia   . HYPERTENSION   . Menopause   . Non-cardiogenic pulmonary edema   . RENAL CYST, LEFT   . SPONDYLOSIS UNSPEC SITE W/O MENTION MYELOPATHY      Family History  Problem Relation Age of Onset  . Lung cancer Brother   . Breast cancer Mother   . Nephrolithiasis Mother   . Hypertension Father   . Heart attack Sister [id number]  . Colon cancer Neg Hx      Current Outpatient Medications:  .  aspirin (ASPIRIN 81) 81 MG EC tablet, Take 81 mg by mouth daily. Swallow whole., Disp: , Rfl:  .  diphenhydrAMINE (BENADRYL) 25 mg capsule, Take 25 mg by mouth every 6 (six) hours as needed for itching., Disp: , Rfl:  .  losartan-hydrochlorothiazide (HYZAAR) 100-25 MG tablet, Take 1 tablet by mouth daily., Disp: , Rfl:  .  Multiple Vitamin (MULTIVITAMIN WITH MINERALS) TABS tablet, Take 1 tablet by mouth daily., Disp: , Rfl:  .  simvastatin (ZOCOR) 40 MG tablet, Take 40 mg by mouth daily., Disp: , Rfl:    No Known Allergies   Review of Systems  Constitutional: Negative.   Eyes: Negative.   Respiratory: Negative.   Cardiovascular: Negative.  Negative  for chest pain, palpitations and leg swelling.  Gastrointestinal: Negative.   Endocrine: Negative.   Musculoskeletal: Negative.   Skin: Negative.   Neurological: Negative for dizziness, weakness and headaches.  Psychiatric/Behavioral: Negative for confusion. The patient is not nervous/anxious.      Today's Vitals   11/23/18 1435  BP: (!) 118/102  Pulse: 84  Temp: 98.1 F (36.7 C)  TempSrc: Oral  Weight: 111 lb 12.8 oz (50.7 kg)  Height: 5' (1.524 m)  PainSc: 0-No pain   Body mass index is 21.83 kg/m.   Objective:  Physical Exam Vitals signs reviewed.  Constitutional:      Appearance: She is well-developed.  HENT:     Head: Normocephalic and atraumatic.  Eyes:     Pupils: Pupils are equal, round, and reactive to light.  Cardiovascular:     Rate and Rhythm: Normal rate and regular rhythm.     Pulses: Normal pulses.     Heart sounds: Normal heart sounds. No murmur.  Pulmonary:     Effort: Pulmonary effort is normal.     Breath sounds: Normal breath sounds.  Musculoskeletal: Normal range of motion.  Skin:    General: Skin is warm and dry.     Capillary Refill: Capillary refill takes  less than 2 seconds.  Neurological:     General: No focal deficit present.     Mental Status: She is alert and oriented to person, place, and time.     Cranial Nerves: No cranial nerve deficit.  Psychiatric:        Mood and Affect: Mood normal.         Assessment And Plan:      1. Type 2 diabetes mellitus without complication, without long-term current use of insulin (HCC)  Chronic,this is the first time here at this office  She is not on any current medications  Encouraged to limit intake of sugary foods and drinks  Encouraged to increase physical activity to 150 minutes per week - Lipid panel - CMP14 + Anion Gap - Hemoglobin A1c - POCT Urinalysis Dipstick (81002) - POCT UA - Microalbumin  2. Essential hypertension . B/P is controlled.  . CMP ordered to check renal  function.  . The importance of regular exercise and dietary modification was stressed to the patient.  . Stressed importance of losing ten percent of her body weight to help with B/P control.  . The weight loss would help with decreasing cardiac and cancer risk as well.  - Lipid panel  3. Mixed hyperlipidemia  Chronic, controlled  Continue with current medications  Will check lipid panel and encouraged to limit your intake of fried and fatty foods  Denies any muscle cramps/aches - Lipid panel  4. Need for vaccination  Influenza vaccine administered  Encouraged to take Tylenol as needed for fever or muscle aches. - Flu vaccine HIGH DOSE PF (Fluzone High dose)          Arnette Felts, FNP

## 2018-11-24 ENCOUNTER — Other Ambulatory Visit: Payer: Self-pay | Admitting: Nurse Practitioner

## 2018-11-24 DIAGNOSIS — I10 Essential (primary) hypertension: Secondary | ICD-10-CM

## 2018-11-24 LAB — LIPID PANEL
CHOL/HDL RATIO: 2.3 ratio (ref 0.0–4.4)
CHOLESTEROL TOTAL: 236 mg/dL — AB (ref 100–199)
HDL: 102 mg/dL (ref >39)
LDL Calculated: 122 mg/dL — ABNORMAL HIGH (ref 0–99)
TRIGLYCERIDES: 61 mg/dL (ref 0–149)
VLDL Cholesterol Cal: 12 mg/dL (ref 5–40)

## 2018-11-24 LAB — CMP14 + ANION GAP
A/G RATIO: 2 (ref 1.2–2.2)
ALT: 14 IU/L (ref 0–32)
ANION GAP: 15 mmol/L (ref 10.0–18.0)
AST: 16 IU/L (ref 0–40)
Albumin: 4.9 g/dL — ABNORMAL HIGH (ref 3.8–4.8)
Alkaline Phosphatase: 123 IU/L — ABNORMAL HIGH (ref 39–117)
BUN/Creatinine Ratio: 26 (ref 12–28)
BUN: 17 mg/dL (ref 8–27)
Bilirubin Total: 1.1 mg/dL (ref 0.0–1.2)
CALCIUM: 10.2 mg/dL (ref 8.7–10.3)
CO2: 25 mmol/L (ref 20–29)
CREATININE: 0.65 mg/dL (ref 0.57–1.00)
Chloride: 98 mmol/L (ref 96–106)
GFR calc non Af Amer: 93 mL/min/{1.73_m2} (ref >59)
GFR, EST AFRICAN AMERICAN: 108 mL/min/{1.73_m2} (ref >59)
GLOBULIN, TOTAL: 2.5 g/dL (ref 1.5–4.5)
Glucose: 81 mg/dL (ref 65–99)
POTASSIUM: 3.8 mmol/L (ref 3.5–5.2)
SODIUM: 138 mmol/L (ref 134–144)
Total Protein: 7.4 g/dL (ref 6.0–8.5)

## 2018-11-24 LAB — HEMOGLOBIN A1C
ESTIMATED AVERAGE GLUCOSE: 117 mg/dL
Hgb A1c MFr Bld: 5.7 % — ABNORMAL HIGH (ref 4.8–5.6)

## 2018-11-24 MED ORDER — HYDROCHLOROTHIAZIDE 25 MG PO TABS: 25.0000 mg | ORAL_TABLET | Freq: Every day | ORAL | 1 refills | Status: AC

## 2018-11-24 MED ORDER — LOSARTAN POTASSIUM 100 MG PO TABS: 100.0000 mg | ORAL_TABLET | Freq: Every day | ORAL | 1 refills | Status: AC

## 2018-12-07 ENCOUNTER — Ambulatory Visit (INDEPENDENT_AMBULATORY_CARE_PROVIDER_SITE_OTHER): Payer: Medicare Other | Admitting: Nurse Practitioner

## 2018-12-07 ENCOUNTER — Encounter: Payer: Self-pay | Admitting: Nurse Practitioner

## 2018-12-07 VITALS — BP 128/98 | HR 87 | Temp 98.1°F | Ht 60.8 in | Wt 117.8 lb

## 2018-12-07 DIAGNOSIS — I10 Essential (primary) hypertension: Secondary | ICD-10-CM

## 2018-12-07 DIAGNOSIS — R61 Generalized hyperhidrosis: Secondary | ICD-10-CM | POA: Diagnosis not present

## 2018-12-07 MED ORDER — AMLODIPINE BESYLATE 5 MG PO TABS: 5.0000 mg | ORAL_TABLET | Freq: Every day | ORAL | 2 refills | Status: AC

## 2018-12-07 NOTE — Progress Notes (Addendum)
Subjective:     Patient ID: Danielle Patterson , female    DOB: 01/27/1951 , 68 y.o.   MRN: [id number]   Chief Complaint  Patient presents with  . Hypertension    HPI  Hypertension  This is a chronic problem. The current episode started more than 1 year ago. Pertinent negatives include no chest pain, headaches or palpitations.     Past Medical History:  Diagnosis Date  . Diabetes mellitus without complication (HCC)    type 2  . GERD   . HYPERCHOLESTEROLEMIA   . Hyperglycemia   . HYPERTENSION   . Menopause   . Non-cardiogenic pulmonary edema   . RENAL CYST, LEFT   . SPONDYLOSIS UNSPEC SITE W/O MENTION MYELOPATHY      Family History  Problem Relation Age of Onset  . Lung cancer Brother   . Breast cancer Mother   . Nephrolithiasis Mother   . Hypertension Father   . Heart attack Sister 3850  . Colon cancer Neg Hx      Current Outpatient Medications:  .  aspirin (ASPIRIN 81) 81 MG EC tablet, Take 81 mg by mouth daily. Swallow whole., Disp: , Rfl:  .  diphenhydrAMINE (BENADRYL) 25 mg capsule, Take 25 mg by mouth every 6 (six) hours as needed for itching., Disp: , Rfl:  .  hydrochlorothiazide (HYDRODIURIL) 25 MG tablet, Take 1 tablet (25 mg total) by mouth daily., Disp: 90 tablet, Rfl: 1 .  losartan (COZAAR) 100 MG tablet, Take 1 tablet (100 mg total) by mouth daily., Disp: 90 tablet, Rfl: 1 .  Multiple Vitamin (MULTIVITAMIN WITH MINERALS) TABS tablet, Take 1 tablet by mouth daily., Disp: , Rfl:  .  simvastatin (ZOCOR) 40 MG tablet, Take 1 tablet (40 mg total) by mouth daily., Disp: 90 tablet, Rfl: 0   No Known Allergies   Review of Systems  Constitutional: Negative for fatigue.  Respiratory: Negative.   Cardiovascular: Negative.  Negative for chest pain, palpitations and leg swelling.  Endocrine: Negative for polydipsia, polyphagia and polyuria.  Neurological: Negative for dizziness and headaches.     Today's Vitals   12/07/18 1525 12/07/18 1533  BP: (!) 166/98 (!)  128/98  Pulse: 87   Temp: 98.1 F (36.7 C)   TempSrc: Oral   Weight: 117 lb 12.8 oz (53.4 kg)   Height: 5' 0.8" (1.544 m)   PainSc: 0-No pain    Body mass index is 22.4 kg/m.   Objective:  Physical Exam Vitals signs reviewed.  Constitutional:      Appearance: Normal appearance.  Eyes:     General: Lids are normal.     Pupils: Pupils are equal, round, and reactive to light.     Funduscopic exam:    Right eye: No AV nicking. Red reflex present.        Left eye: No AV nicking. Red reflex present. Neck:     Musculoskeletal: Neck supple.  Cardiovascular:     Rate and Rhythm: Normal rate and regular rhythm.     Pulses: Normal pulses.     Heart sounds: Normal heart sounds. No murmur.  Pulmonary:     Effort: Pulmonary effort is normal.     Breath sounds: Normal breath sounds.  Skin:    General: Skin is warm.     Capillary Refill: Capillary refill takes less than 2 seconds.         Assessment And Plan:     1. Essential hypertension . B/P continues to be elevated. .Marland Kitchen  CMP ordered to check renal function.  . The importance of regular exercise and dietary modification was stressed to the patient.  . Advised to avoid increased salt intake. . She is to take 1/2 tab of amlodipine daily and check blood daily.  - amLODipine (NORVASC) 5 MG tablet; Take 1 tablet (5 mg total) by mouth daily.  Dispense: 30 tablet; Refill: 2  2. Night sweats  Will check her thyroid levels, likely related to menopause  She does not sleep well, will consider a sleep study  - TSH      Arnette Felts, FNP

## 2018-12-07 NOTE — Patient Instructions (Signed)
   IF YOUR BLOOD PRESSURE DROPS BELOW 120/80 CALL THE OFFICE.

## 2018-12-08 LAB — TSH: TSH: 0.86 u[IU]/mL (ref 0.450–4.500)

## 2018-12-21 ENCOUNTER — Ambulatory Visit (INDEPENDENT_AMBULATORY_CARE_PROVIDER_SITE_OTHER): Payer: Medicare Other | Admitting: Nurse Practitioner

## 2018-12-21 ENCOUNTER — Encounter: Payer: Self-pay | Admitting: Nurse Practitioner

## 2018-12-21 ENCOUNTER — Other Ambulatory Visit: Payer: Self-pay

## 2018-12-21 VITALS — BP 138/70 | HR 79 | Temp 97.4°F | Ht 60.8 in | Wt 122.8 lb

## 2018-12-21 DIAGNOSIS — I10 Essential (primary) hypertension: Secondary | ICD-10-CM | POA: Diagnosis not present

## 2018-12-21 NOTE — Progress Notes (Signed)
Subjective:     Patient ID: Danielle Patterson , female    DOB: [birth year] , 68 y.o.   MRN: [id number]   Chief Complaint  Patient presents with  . Hypertension    HPI  Hypertension  This is a chronic problem. The current episode started more than 1 month ago. The problem has been gradually improving since onset. The problem is controlled (improved this visit.). Pertinent negatives include no anxiety, chest pain, headaches or palpitations.     Past Medical History:  Diagnosis Date  . Diabetes mellitus without complication (HCC)    type 2  . GERD   . HYPERCHOLESTEROLEMIA   . Hyperglycemia   . HYPERTENSION   . Menopause   . Non-cardiogenic pulmonary edema   . RENAL CYST, LEFT   . SPONDYLOSIS UNSPEC SITE W/O MENTION MYELOPATHY      Family History  Problem Relation Age of Onset  . Lung cancer Brother   . Breast cancer Mother   . Nephrolithiasis Mother   . Hypertension Father   . Heart attack Sister 66  . Colon cancer Neg Hx      Current Outpatient Medications:  .  amLODipine (NORVASC) 5 MG tablet, Take 1 tablet (5 mg total) by mouth daily., Disp: 30 tablet, Rfl: 2 .  aspirin (ASPIRIN 81) 81 MG EC tablet, Take 81 mg by mouth daily. Swallow whole., Disp: , Rfl:  .  diphenhydrAMINE (BENADRYL) 25 mg capsule, Take 25 mg by mouth every 6 (six) hours as needed for itching., Disp: , Rfl:  .  hydrochlorothiazide (HYDRODIURIL) 25 MG tablet, Take 1 tablet (25 mg total) by mouth daily., Disp: 90 tablet, Rfl: 1 .  losartan (COZAAR) 100 MG tablet, Take 1 tablet (100 mg total) by mouth daily., Disp: 90 tablet, Rfl: 1 .  Multiple Vitamin (MULTIVITAMIN WITH MINERALS) TABS tablet, Take 1 tablet by mouth daily., Disp: , Rfl:  .  simvastatin (ZOCOR) 40 MG tablet, Take 1 tablet (40 mg total) by mouth daily., Disp: 90 tablet, Rfl: 0   No Known Allergies   Review of Systems  Constitutional: Negative for fatigue.  Respiratory: Negative.   Cardiovascular: Negative.  Negative for chest pain,  palpitations and leg swelling.  Endocrine: Negative for polydipsia, polyphagia and polyuria.  Neurological: Negative for dizziness and headaches.     Today's Vitals   12/21/18 1459  BP: 138/70  Pulse: 79  Temp: (!) 97.4 F (36.3 C)  TempSrc: Oral  SpO2: 97%  Weight: 122 lb 12.8 oz (55.7 kg)  Height: 5' 0.8" (1.544 m)   Body mass index is 23.36 kg/m.   Objective:  Physical Exam Vitals signs reviewed.  Constitutional:      Appearance: She is well-developed.  HENT:     Head: Normocephalic and atraumatic.  Eyes:     Pupils: Pupils are equal, round, and reactive to light.  Cardiovascular:     Rate and Rhythm: Normal rate and regular rhythm.     Pulses: Normal pulses.     Heart sounds: Normal heart sounds. No murmur.  Pulmonary:     Effort: Pulmonary effort is normal.     Breath sounds: Normal breath sounds.  Musculoskeletal: Normal range of motion.  Skin:    General: Skin is warm and dry.     Capillary Refill: Capillary refill takes less than 2 seconds.  Neurological:     General: No focal deficit present.     Mental Status: She is alert and oriented to person, place, and time.  Cranial Nerves: No cranial nerve deficit.  Psychiatric:        Mood and Affect: Mood normal.         Assessment And Plan:     1. Essential hypertension  Her blood pressure is improved to controlled range  She is to continue with the 2.5mg  of amlodipine and to avoid high salt foods.    Arnette Felts, FNP

## 2019-01-12 DIAGNOSIS — H40033 Anatomical narrow angle, bilateral: Secondary | ICD-10-CM | POA: Diagnosis not present

## 2019-01-12 DIAGNOSIS — H2513 Age-related nuclear cataract, bilateral: Secondary | ICD-10-CM | POA: Diagnosis not present

## 2019-02-22 ENCOUNTER — Ambulatory Visit: Payer: Medicare Other

## 2019-03-28 ENCOUNTER — Encounter: Payer: Medicare Other | Admitting: Nurse Practitioner

## 2019-03-28 ENCOUNTER — Ambulatory Visit: Payer: Medicare Other

## 2019-04-12 ENCOUNTER — Telehealth: Payer: Self-pay | Admitting: Nurse Practitioner

## 2019-04-12 NOTE — Telephone Encounter (Signed)
I called the patient to schedule her Welcome to Medicare visit with Danielle Patterson.  She said that she signed up for [facility], and they provided her with a new PCP at [facility].  She wasn't able to give me the new PCP's name. VDM (DD)

## 2019-04-12 NOTE — Telephone Encounter (Signed)
Okay thank you I updated on her chart to remove my name based on what the patient has stated.

## 2020-04-03 ENCOUNTER — Telehealth: Payer: Self-pay

## 2020-04-03 NOTE — Telephone Encounter (Signed)
I called patient to see if she was still coming here because we received a medical clearance form stating her dental procedure was cancelled due to her blood pressure. She stated she is no longer a pt here at [facility]. YL,RMA

## 2020-05-11 ENCOUNTER — Other Ambulatory Visit: Payer: Self-pay

## 2020-05-11 ENCOUNTER — Encounter (HOSPITAL_COMMUNITY): Payer: Self-pay | Admitting: *Deleted

## 2020-05-11 ENCOUNTER — Emergency Department (HOSPITAL_COMMUNITY): Payer: Medicare Other

## 2020-05-11 ENCOUNTER — Emergency Department (HOSPITAL_COMMUNITY)
Admission: EM | Admit: 2020-05-11 | Discharge: 2020-05-11 | Disposition: A | Discharge status: Home / Self Care | Payer: Medicare Other | Attending: Emergency Medicine | Admitting: Emergency Medicine

## 2020-05-11 DIAGNOSIS — Z7982 Long term (current) use of aspirin: Secondary | ICD-10-CM | POA: Diagnosis not present

## 2020-05-11 DIAGNOSIS — I10 Essential (primary) hypertension: Secondary | ICD-10-CM | POA: Insufficient documentation

## 2020-05-11 DIAGNOSIS — Z87891 Personal history of nicotine dependence: Secondary | ICD-10-CM | POA: Diagnosis not present

## 2020-05-11 DIAGNOSIS — Z79899 Other long term (current) drug therapy: Secondary | ICD-10-CM | POA: Insufficient documentation

## 2020-05-11 DIAGNOSIS — E1165 Type 2 diabetes mellitus with hyperglycemia: Secondary | ICD-10-CM | POA: Diagnosis not present

## 2020-05-11 LAB — CBC
HCT: 42.6 % (ref 36.0–46.0)
Hemoglobin: 14 g/dL (ref 12.0–15.0)
MCH: 26.4 pg (ref 26.0–34.0)
MCHC: 32.9 g/dL (ref 30.0–36.0)
MCV: 80.2 fL (ref 80.0–100.0)
Platelets: 230 10*3/uL (ref 150–400)
RBC: 5.31 MIL/uL — ABNORMAL HIGH (ref 3.87–5.11)
RDW: 14.4 % (ref 11.5–15.5)
WBC: 6.7 10*3/uL (ref 4.0–10.5)
nRBC: 0 % (ref 0.0–0.2)

## 2020-05-11 LAB — URINALYSIS, ROUTINE W REFLEX MICROSCOPIC
Bilirubin Urine: NEGATIVE
Glucose, UA: NEGATIVE mg/dL
Hgb urine dipstick: NEGATIVE
Ketones, ur: NEGATIVE mg/dL
Leukocytes,Ua: NEGATIVE
Nitrite: NEGATIVE
Protein, ur: NEGATIVE mg/dL
Specific Gravity, Urine: 1.006 (ref 1.005–1.030)
pH: 8 (ref 5.0–8.0)

## 2020-05-11 LAB — COMPREHENSIVE METABOLIC PANEL
ALT: 21 U/L (ref 0–44)
AST: 20 U/L (ref 15–41)
Albumin: 4.8 g/dL (ref 3.5–5.0)
Alkaline Phosphatase: 107 U/L (ref 38–126)
Anion gap: 11 (ref 5–15)
BUN: 14 mg/dL (ref 8–23)
CO2: 24 mmol/L (ref 22–32)
Calcium: 10 mg/dL (ref 8.9–10.3)
Chloride: 102 mmol/L (ref 98–111)
Creatinine, Ser: 0.66 mg/dL (ref 0.44–1.00)
GFR calc Af Amer: >60 mL/min (ref >60)
GFR calc non Af Amer: >60 mL/min (ref >60)
Glucose, Bld: 105 mg/dL — ABNORMAL HIGH (ref 70–99)
Potassium: 3.7 mmol/L (ref 3.5–5.1)
Sodium: 137 mmol/L (ref 135–145)
Total Bilirubin: 1 mg/dL (ref 0.3–1.2)
Total Protein: 8.2 g/dL — ABNORMAL HIGH (ref 6.5–8.1)

## 2020-05-11 LAB — LIPASE, BLOOD: Lipase: 25 U/L (ref 11–51)

## 2020-05-11 MED ORDER — LOSARTAN POTASSIUM 50 MG PO TABS
100.0000 mg | ORAL_TABLET | Freq: Every day | ORAL | Status: DC
  Administered 2020-05-11: 100 mg via ORAL
  Filled 2020-05-11: qty 2

## 2020-05-11 MED ORDER — HYDROCHLOROTHIAZIDE 25 MG PO TABS
25.0000 mg | ORAL_TABLET | Freq: Every day | ORAL | Status: DC
  Administered 2020-05-11: 25 mg via ORAL
  Filled 2020-05-11: qty 1

## 2020-05-11 MED ORDER — SODIUM CHLORIDE 0.9% FLUSH: 3.0000 mL | Freq: Once | INTRAVENOUS | Status: DC

## 2020-05-11 NOTE — Discharge Instructions (Signed)
Please follow-up with your doctor this week, continue to take amlodipine daily as well as Coreg twice a day as recently prescribed by your doctor.  Your testing here has been reassuring but you will need to have your blood pressure checked within 1 week.  Return to the ER for severe or worsening headache, chest pain, difficulty with weakness or shortness of breath or any other severe or worsening symptoms.  Please measure your blood pressure every morning 2 hours after taking your medication and record this, sure these numbers with your doctor

## 2020-05-11 NOTE — ED Triage Notes (Signed)
Elevated bp today headache also

## 2020-05-11 NOTE — ED Notes (Signed)
Pt discharge instructions reviewed with the patient. The patient verbalized understanding. Pt discharged. 

## 2020-05-11 NOTE — ED Provider Notes (Signed)
[facility] EMERGENCY DEPARTMENT Provider Note   CSN: [id number] Arrival date & time: 05/11/20  0105     History Chief Complaint  Patient presents with  . Hypertension    Danielle Patterson is a 69 y.o. female.  HPI   pt is a pleasant 69 year old female, she has a known history of hypertension and hypercholesterolemia as well as diabetes. She presents to the hospital with a complaint of hypertension. His hypertension has been rather recent in onset as far as being poorly controlled. 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 She did not bring her medicines with her. She has some intermittent headaches, intermittent visual changes, no chest pain or shortness of breath. She at this time is totally asymptomatic after waiting for approximately 7 hours for room in the waiting room to the emergency department bed. Current blood pressure is 200/116, she is afebrile without any other vital sign abnormalities.  Past Medical History:  Diagnosis Date  . Diabetes mellitus without complication (HCC)    type 2  . GERD   . HYPERCHOLESTEROLEMIA   . Hyperglycemia   . HYPERTENSION   . Menopause   . Non-cardiogenic pulmonary edema   . RENAL CYST, LEFT   . SPONDYLOSIS UNSPEC SITE W/O MENTION MYELOPATHY     Patient Active Problem List   Diagnosis Date Noted  . Mixed hyperlipidemia 11/23/2018  . Chest pain 03/04/2018  . Menopausal state 01/14/2016  . Thyroid function study abnormality 12/24/2014  . Smoker 12/24/2014  . Routine general medical examination at a health care facility 08/25/2013  . Diabetes (HCC) 08/24/2013  . Encounter for long-term  (current) use of other medications 11/03/2012  . RENAL CYST, LEFT 03/11/2009  . 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 ABDOMINAL PAIN, LOWER 10/21/2008  . COUGH 07/25/2008  . HYPERCHOLESTEROLEMIA 10/24/2007  . ASYMPTOMATIC POSTMENOPAUSAL STATUS 10/24/2007  . Essential hypertension 05/25/2007  . GERD 05/25/2007    Past Surgical History:  Procedure Laterality Date  . ABDOMINAL HYSTERECTOMY    . CYST EXCISION     chest  . TUBAL LIGATION       OB History   No obstetric history on file.     Family History  Problem Relation Age of Onset  . Lung cancer Brother   . Breast cancer Mother   . Nephrolithiasis Mother   . Hypertension Father   . Heart attack Sister 36  . Colon cancer Neg Hx     Social History   Tobacco Use  . Smoking status: Former Smoker    Years: 5.00    Quit date: 10/25/1997    Years since quitting: 22.5  . Smokeless tobacco: Never Used  Vaping Use  . Vaping Use: Never used  Substance Use Topics  . Alcohol use: No  . Drug use: No    Home Medications Prior to Admission medications   Medication Sig Start Date End Date Taking? Authorizing Provider  aspirin (ASPIRIN 81) 81 MG EC tablet Take 81 mg by mouth daily. Swallow whole.   Yes [provider]  carvedilol (COREG) 6.25 MG tablet Take 6.25 mg by mouth 2 (two) times daily. 05/02/20  Yes [provider]  diphenhydrAMINE (BENADRYL) 25 mg capsule Take 25 mg by mouth every 6 (six) hours as needed for itching.   Yes [provider]  fluticasone (FLONASE) 50 MCG/ACT nasal spray Place 1 spray into both nostrils daily as needed. 05/10/20  Yes [provider]  Multiple Vitamin (MULTIVITAMIN WITH MINERALS) TABS tablet Take 1 tablet by mouth daily.   Yes [provider]  simvastatin (ZOCOR) 40 MG tablet Take 1 tablet (40 mg total) by mouth daily. Patient taking differently: Take 40 mg by mouth at bedtime.  11/23/18  Yes Arnette FeltsMoore, Janece, FNP  amLODipine  (NORVASC) 5 MG tablet Take 1 tablet (5 mg total) by mouth daily. Patient not taking: Reported on 05/11/2020 12/07/18 12/07/19  Arnette FeltsMoore, Janece, FNP  hydrochlorothiazide (HYDRODIURIL) 25 MG tablet Take 1 tablet (25 mg total) by mouth daily. Patient not taking: Reported on 05/11/2020 11/24/18   Arnette FeltsMoore, Janece, FNP  losartan (COZAAR) 100 MG tablet Take 1 tablet (100 mg total) by mouth daily. Patient not taking: Reported on 05/11/2020 11/24/18   Arnette FeltsMoore, Janece, FNP    Allergies    Patient has no known allergies.  Review of Systems   Review of Systems  All other systems reviewed and are negative.   Physical Exam Updated Vital Signs BP (!) 180/120   Pulse 83   Temp 98.8 F (37.1 C) (Oral)   Resp 16   Ht 1.524 m (5')   Wt 55.7 kg   SpO2 99%   BMI 23.98 kg/m   Physical Exam Vitals and nursing note reviewed.  Constitutional:      General: She is not in acute distress.    Appearance: She is well-developed.  HENT:     Head: Normocephalic and atraumatic.     Nose: No congestion or rhinorrhea.     Mouth/Throat:     Pharynx: No oropharyngeal exudate.  Eyes:     General: No scleral icterus.       Right eye: No discharge.        Left eye: No discharge.     Conjunctiva/sclera: Conjunctivae normal.     Pupils: Pupils are equal, round, and reactive to light.  Neck:     Thyroid: No thyromegaly.     Vascular: No JVD.  Cardiovascular:     Rate and Rhythm: Normal rate and regular rhythm.     Heart sounds: Normal heart sounds. No murmur heard.  No friction rub. No gallop.   Pulmonary:     Effort: Pulmonary effort is normal. No respiratory distress.     Breath sounds: Normal breath sounds. No wheezing or rales.  Abdominal:     General: Bowel sounds are normal. There is no distension.     Palpations: Abdomen is soft. There is no mass.     Tenderness: There is no abdominal tenderness.  Musculoskeletal:        General: No tenderness or signs of injury. Normal range of motion.     Cervical back:  Normal range of motion and neck supple.     Right lower leg: No edema.     Left lower leg: No edema.  Lymphadenopathy:     Cervical: No cervical adenopathy.  Skin:    General: Skin is warm and dry.     Findings: No erythema or rash.  Neurological:     General: No focal deficit present.     Mental Status: She is alert.     Coordination: Coordination normal.  Psychiatric:  Behavior: Behavior normal.     ED Results / Procedures / Treatments   Labs (all labs ordered are listed, but only abnormal results are displayed) Labs Reviewed  COMPREHENSIVE METABOLIC PANEL - Abnormal; Notable for the following components:      Result Value   Glucose, Bld 105 (*)    Total Protein 8.2 (*)    All other components within normal limits  CBC - Abnormal; Notable for the following components:   RBC 5.31 (*)    All other components within normal limits  URINALYSIS, ROUTINE W REFLEX MICROSCOPIC - Abnormal; Notable for the following components:   Color, Urine COLORLESS (*)    All other components within normal limits  LIPASE, BLOOD    EKG EKG Interpretation  Date/Time:  Sunday May 11 2020 01:30:57 EDT Ventricular Rate:  84 PR Interval:  178 QRS Duration: 70 QT Interval:  372 QTC Calculation: 439 R Axis:   -20 Text Interpretation: Normal sinus rhythm Anterior infarct , age undetermined Abnormal ECG When compared with ECG of 03/04/2018, No significant change was found Confirmed by Dione Booze ([id number]) on 05/11/2020 2:17:33 AM   Radiology DG Chest 2 View  Result Date: 05/11/2020 CLINICAL DATA:  69 year old female with hypertension. EXAM: CHEST - 2 VIEW COMPARISON:  Chest radiograph dated 03/04/2018. FINDINGS: No focal consolidation, pleural effusion, or pneumothorax. The cardiac silhouette is within limits. The aorta is somewhat tortuous. No acute osseous pathology. Degenerative changes of the spine. IMPRESSION: No active cardiopulmonary disease. Electronically Signed   By: Elgie Collard M.D.   On: 05/11/2020 02:31    Procedures Procedures (including critical care time)  Medications Ordered in ED Medications  sodium chloride flush (NS) 0.9 % injection 3 mL (has no administration in time range)  hydrochlorothiazide (HYDRODIURIL) tablet 25 mg (25 mg Oral Given 05/11/20 0826)  losartan (COZAAR) tablet 100 mg (100 mg Oral Given 05/11/20 0827)    ED Course  I have reviewed the triage vital signs and the nursing notes.  Pertinent labs & imaging results that were available during my care of the patient were reviewed by me and considered in my medical decision making (see chart for details).    MDM Rules/Calculators/A&P                          Overall this patient appears well despite what appears to be asymptomatic hypertension. Labs reviewed showing no signs of endorgan dysfunction. There is no chest pain exertional symptoms headaches or neurologic abnormalities at this time. She has not had her morning medications because she has been in the emergency department all night. These have been ordered. I will have the pharmacy tech review with her pharmacy, the [facility] on [address] [place] to see what she is actually taking as she does not know the names of her medicines.  The pharmacy technician has reported that this patient takes carvedilol 6.25 mg twice a day, this was the new medication, she was discontinued on all her other medications.  EMR reviewed - there was no notes re: why meds were discontinued from the office.  The pt does not want to take prior Amlodipine (no peripheral edema), b/c she thinks that this caused her to have frequently urination - will add back the ARB as this seems the most safe and least symptomatic.    I have reexamined the patient, blood pressure is currently 180/120 which is improved, she is still asymptomatic, she states that she does not  want to start any other new medications and wants her medicine to "just get into her bloodstream".   She now reports that she is actually on amlodipine, she has also been called in some Flonase for some nasal congestion.  She appears well, she understands indications for return, stable for discharge, has good follow-up  Final Clinical Impression(s) / ED Diagnoses Final diagnoses:  Essential hypertension    Rx / DC Orders ED Discharge Orders    None       Eber Hong, MD 05/11/20 1015

## 2020-10-14 ENCOUNTER — Other Ambulatory Visit: Payer: Self-pay | Admitting: General Practice

## 2020-10-14 ENCOUNTER — Encounter (HOSPITAL_COMMUNITY): Payer: Self-pay | Admitting: *Deleted

## 2020-10-14 ENCOUNTER — Other Ambulatory Visit: Payer: Self-pay

## 2020-10-14 DIAGNOSIS — Z1231 Encounter for screening mammogram for malignant neoplasm of breast: Secondary | ICD-10-CM

## 2020-10-14 DIAGNOSIS — Z20822 Contact with and (suspected) exposure to covid-19: Secondary | ICD-10-CM

## 2020-10-16 LAB — NOVEL CORONAVIRUS, NAA: SARS-CoV-2, NAA: NOT DETECTED

## 2020-10-16 LAB — SARS-COV-2, NAA 2 DAY TAT

## 2020-11-24 ENCOUNTER — Other Ambulatory Visit: Payer: Self-pay

## 2020-11-24 ENCOUNTER — Ambulatory Visit
Admission: RE | Admit: 2020-11-24 | Discharge: 2020-11-24 | Disposition: A | Discharge status: Home / Self Care | Payer: Medicare Other | Source: Ambulatory Visit | Attending: General Practice | Admitting: General Practice

## 2020-11-24 DIAGNOSIS — Z1231 Encounter for screening mammogram for malignant neoplasm of breast: Secondary | ICD-10-CM

## 2021-11-12 ENCOUNTER — Other Ambulatory Visit: Payer: Self-pay | Admitting: Family Medicine

## 2021-11-12 DIAGNOSIS — E2839 Other primary ovarian failure: Secondary | ICD-10-CM

## 2021-11-12 DIAGNOSIS — Z780 Asymptomatic menopausal state: Secondary | ICD-10-CM

## 2022-03-04 ENCOUNTER — Other Ambulatory Visit: Payer: Self-pay | Admitting: General Practice

## 2022-03-04 ENCOUNTER — Other Ambulatory Visit: Payer: Self-pay | Admitting: Family Medicine

## 2022-03-04 DIAGNOSIS — Z1231 Encounter for screening mammogram for malignant neoplasm of breast: Secondary | ICD-10-CM

## 2022-03-10 ENCOUNTER — Ambulatory Visit
Admission: RE | Admit: 2022-03-10 | Discharge: 2022-03-10 | Disposition: A | Discharge status: Home / Self Care | Payer: Medicare HMO | Source: Ambulatory Visit | Attending: Family Medicine | Admitting: Family Medicine

## 2022-03-10 DIAGNOSIS — Z1231 Encounter for screening mammogram for malignant neoplasm of breast: Secondary | ICD-10-CM

## 2022-06-25 ENCOUNTER — Ambulatory Visit (HOSPITAL_COMMUNITY)
Admission: RE | Admit: 2022-06-25 | Discharge: 2022-06-25 | Disposition: A | Discharge status: Home / Self Care | Payer: Medicare Other | Source: Ambulatory Visit | Attending: Family Medicine | Admitting: Family Medicine

## 2022-06-25 ENCOUNTER — Other Ambulatory Visit: Payer: Self-pay | Admitting: Family Medicine

## 2022-06-25 DIAGNOSIS — R109 Unspecified abdominal pain: Secondary | ICD-10-CM

## 2022-06-25 DIAGNOSIS — R10A2 Flank pain, left side: Secondary | ICD-10-CM

## 2022-06-29 ENCOUNTER — Other Ambulatory Visit: Payer: Medicare HMO

## 2022-06-30 ENCOUNTER — Other Ambulatory Visit: Payer: Self-pay | Admitting: Family Medicine

## 2022-06-30 DIAGNOSIS — R109 Unspecified abdominal pain: Secondary | ICD-10-CM

## 2022-06-30 DIAGNOSIS — R10A2 Flank pain, left side: Secondary | ICD-10-CM

## 2022-07-14 ENCOUNTER — Ambulatory Visit
Admission: RE | Admit: 2022-07-14 | Discharge: 2022-07-14 | Disposition: A | Discharge status: Home / Self Care | Payer: Medicare Other | Source: Ambulatory Visit | Attending: Family Medicine | Admitting: Family Medicine

## 2022-07-14 DIAGNOSIS — R109 Unspecified abdominal pain: Secondary | ICD-10-CM

## 2022-07-14 DIAGNOSIS — R10A2 Flank pain, left side: Secondary | ICD-10-CM

## 2023-02-15 ENCOUNTER — Other Ambulatory Visit: Payer: Self-pay | Admitting: Family Medicine

## 2023-02-15 DIAGNOSIS — Z1231 Encounter for screening mammogram for malignant neoplasm of breast: Secondary | ICD-10-CM

## 2023-03-29 ENCOUNTER — Ambulatory Visit: Payer: Medicare HMO

## 2023-03-29 ENCOUNTER — Ambulatory Visit
Admission: RE | Admit: 2023-03-29 | Discharge: 2023-03-29 | Disposition: A | Discharge status: Home / Self Care | Payer: Medicare HMO | Source: Ambulatory Visit | Attending: Family Medicine | Admitting: Family Medicine

## 2023-03-29 DIAGNOSIS — Z1231 Encounter for screening mammogram for malignant neoplasm of breast: Secondary | ICD-10-CM

## 2023-04-04 ENCOUNTER — Encounter: Payer: Self-pay | Admitting: Gastroenterology

## 2023-04-27 ENCOUNTER — Telehealth: Payer: Self-pay

## 2023-04-27 ENCOUNTER — Ambulatory Visit: Payer: 59

## 2023-04-27 ENCOUNTER — Telehealth: Payer: Self-pay | Admitting: *Deleted

## 2023-04-27 NOTE — Telephone Encounter (Signed)
No show for PV. Patient was called twice. Was not able to leave a message. Phone would ring and then go into a busy signal. A no show letter was mailed and procedure is cancelled per [facility] guidelines.

## 2023-04-27 NOTE — Telephone Encounter (Signed)
1st attempt  rand twice then went to rapid busy signal. Unable to leave message. Will attempt in 5 minutes.  2nd attempt same issue rang 2 times then straight to busy signal. Per protocol if pt does not contact facility by end of day to reschedule pre-visit the  procedure and visit will be canceled.

## 2023-04-27 NOTE — Progress Notes (Unsigned)
Pt's name and DOB verified at the beginning of the pre-visit.  Pt denies any difficulty with ambulating,sitting, laying down or rolling side to side Gave both [facility] main # and MD on call # prior to instructions.  No egg or soy allergy known to patient  No issues known to pt with past sedation with any surgeries or procedures Pt denies having issues being intubated Patient denies ever being intubated Pt has no issues moving head neck or swallowing No FH of Malignant Hyperthermia Pt is not on diet pills Pt is not on home 02  Pt is not on blood thinners  Pt denies issues with constipation  Pt has frequent issues with constipation RN instructed pt to use Miralax per bottles instructions a week before prep days. Pt states they will Pt is not on dialysis Pt denise any abnormal heart rhythms  Pt denies any upcoming cardiac testing Pt encouraged to use to use Singlecare or Goodrx to reduce cost  Patient's chart reviewed by John Nulty CNRA prior to pre-visit and patient appropriate for the [facility].  Pre-visit completed and red dot placed by patient's name on their procedure day (on provider's schedule).  . Visit by phone Pt states weight is  Instructed pt why it is important to and  to call if they have any changes in health or new medications. Directed them to the # given and on instructions.   Pt states they will.  Instructions reviewed with pt and pt states understanding. Instructed to review again prior to procedure. Pt states they will.  Instructions sent by mail with coupon and by my chart   

## 2023-05-11 ENCOUNTER — Encounter: Payer: 59 | Admitting: Gastroenterology

## 2023-09-21 ENCOUNTER — Other Ambulatory Visit: Payer: Self-pay | Admitting: Family Medicine

## 2023-09-21 DIAGNOSIS — R3589 Other polyuria: Secondary | ICD-10-CM

## 2023-09-26 ENCOUNTER — Ambulatory Visit
Admission: RE | Admit: 2023-09-26 | Discharge: 2023-09-26 | Disposition: A | Discharge status: Home / Self Care | Payer: 59 | Source: Ambulatory Visit | Attending: Family Medicine | Admitting: Family Medicine

## 2023-09-26 DIAGNOSIS — R3589 Other polyuria: Secondary | ICD-10-CM

## 2023-10-31 NOTE — Progress Notes (Signed)
 [facility] Initial Consultation   Referring Provider Maree Leni Edyth DELENA, MD [address] [place],  [place] [address]  Primary Care Provider Maree Leni Edyth DELENA, MD  Patient Profile: Danielle Patterson is a 73 y.o. female with a past medical history noteworthy for HTN, T2DM and hyperlipidemia who is seen in consultation in the [facility] at the request of Dr. Maree for evaluation and management of the problem(s) noted below.  Problem List: Screening colonoscopy Hemorrhoids GERD  History of Present Illness   Danielle Patterson is a 73 y.o. female with a history of HTN, T2DM and hyperlipidemia who presents for evaluation of screening colonoscopy and history of GERD  Screening colonoscopy Previous colonoscopy history 03/2013 - internal hemorrhoids, otherwise normal -complete to cecum with adequate bowel prep 03/2002 -internal hemorrhoids, otherwise normal  Reports normal bowel habits stooling 1-2 times per day -takes Metamucil on a daily basis in conjunction with applesauce which keeps her bowels regular Bowel movements are solid without any blood or mucus No fecal urgency, tenesmus Denies any anorectal pain or problems with defecation  No known family history of colorectal cancer or polyps  Reports that she tolerated previous colonoscopies well without any anesthetic complications No history of abdominal surgeries No anticoagulation other than a baby aspirin   Hemorrhoids Has a history of hemorrhoids that are not currently bothering her Previous colonoscopies have documented internal hemorrhoids No history of thrombosed hemorrhoids or previous hemorrhoid surgery/procedures Denies pain but sometimes has prolapse of her hemorrhoids Has used Metamucil and states that this has been beneficial in alleviating some of the prolapse issues On occasion has used topical hemorrhoidal ointment but none recently  GERD Endorses a previous history of GERD but this is not  currently active Reports she is modified her diet by eliminating fried foods, hot sauce and spicy foods which has essentially resolved her GERD issues Has not required any antacid or interval Tums use No dysphagia or odynophagia  Last colonoscopy: 03/2013 - internal hemorrhoids, otherwise normal -complete to cecum with adequate bowel prep Last endoscopy: 03/2002 - multiple gastric erosions with low-grade bleeding  Last Abd CT/CTE/MRE: None recent  GI Review of Symptoms Significant for minimal constipation which is effectively treated with Metamucil. Otherwise negative.  General Review of Systems  Review of systems is significant for the pertinent positives and negatives as listed per the HPI.  Full ROS is otherwise negative.  Past Medical History   Past Medical History:  Diagnosis Date   Diabetes mellitus without complication (HCC)    type 2   GERD    HYPERCHOLESTEROLEMIA    Hyperglycemia    HYPERTENSION    Menopause    Non-cardiogenic pulmonary edema    RENAL CYST, LEFT    SPONDYLOSIS UNSPEC SITE W/O MENTION MYELOPATHY       Past Surgical History   Past Surgical History:  Procedure Laterality Date   ABDOMINAL HYSTERECTOMY     CYST EXCISION     chest   TUBAL LIGATION       Allergies and Medications  No Known Allergies   Current Meds  Medication Sig   amLODipine  (NORVASC ) 5 MG tablet Take 1 tablet (5 mg total) by mouth daily.   aspirin  (ASPIRIN  81) 81 MG EC tablet Take 81 mg by mouth daily. Swallow whole.   atorvastatin  (LIPITOR) 20 MG tablet Take 20 mg by mouth daily.   carvedilol (COREG) 6.25 MG tablet Take 6.25 mg by mouth 2 (two) times daily.   diphenhydrAMINE (BENADRYL) 25 mg capsule  Take 25 mg by mouth every 6 (six) hours as needed for itching.   fluticasone  (FLONASE ) 50 MCG/ACT nasal spray Place 1 spray into both nostrils daily as needed.   Multiple Vitamin (MULTIVITAMIN WITH MINERALS) TABS tablet Take 1 tablet by mouth daily.   Na Sulfate-K Sulfate-Mg Sulf  (SUPREP BOWEL PREP KIT) 17.5-3.13-1.6 GM/177ML SOLN Take 1 kit by mouth as directed.   simvastatin  (ZOCOR ) 40 MG tablet Take 1 tablet (40 mg total) by mouth daily. 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 She started smoking about 31 years ago. She has never used smokeless tobacco. She reports that she does not drink alcohol and does not use drugs.  Vital Signs and Physical Examination   Vitals:   11/01/23 1316  BP: (!) 142/88  Pulse: 79  SpO2: 97%   Body mass index is 21.67 kg/m. Weight: 118 lb 8 oz (53.8 kg)  Constitutional: thin appearing nontoxic appearing 73 y.o. female in no apparent distress.   Eyes: Anicteric sclerae Head: Normocephalic/atraumatic Neck: No masses Pulmonary: Clear to auscultation bilaterally CV: S1, S2, regular rate and rhythm GI/Abdomen: Soft, nontender, nondistended, normoactive bowel sounds Anorectal: Grade 2 hemorrhoids Neuro: Grossly intact Skin: Warm, dry, no rash Bilateral Extremities: No edema   Review of Data  The following data was reviewed at the time of this encounter:  Laboratory Studies   Lab Results  Component Value Date   WBC 6.7 05/11/2020   HGB 14.0 05/11/2020   HCT 42.6 05/11/2020   MCV 80.2 05/11/2020   PLT 230 05/11/2020     Chemistry      Component Value Date/Time   NA 137 05/11/2020 0141   NA 138 11/23/2018 1521   K 3.7 05/11/2020 0141    CL 102 05/11/2020 0141   CO2 24 05/11/2020 0141   BUN 14 05/11/2020 0141   BUN 17 11/23/2018 1521   CREATININE 0.66 05/11/2020 0141   CREATININE 0.72 11/03/2012 1644      Component Value Date/Time   CALCIUM  10.0 05/11/2020 0141   ALKPHOS 107 05/11/2020 0141   AST 20 05/11/2020 0141   ALT 21 05/11/2020 0141   BILITOT 1.0 05/11/2020 0141   BILITOT 1.1 11/23/2018 1521      Lab Results  Component Value Date   TSH 0.860 12/07/2018    Imaging Studies  None recent  GI Procedures and Studies  Colonoscopy 03/2013  Internal hemorrhoids, otherwise normal -complete to cecum with adequate bowel prep  EGD 03/2002  Multiple gastric erosions with low-grade bleeding  Colonoscopy 03/2002  Internal hemorrhoids, otherwise normal   Clinical Impression  It is my clinical impression that Ms. Milbourn is a 73 y.o. female with;  Screening colonoscopy Hemorrhoids GERD  Ms. Patty presents to discuss screening colonoscopy, hemorrhoids and  a history of GERD.  She has had 2 lifetime colonoscopies in 2003 in 2014 which have been normal without any history of adenomatous polyps.  No family history of colorectal cancer or polyps.  She is not endorsing any alarm symptoms.  We discussed scheduling a screening colonoscopy in the [facility].  Risks and benefits were reviewed and she is amenable to proceeding.  In terms of her history of hemorrhoids, these are not particularly problematic at this time.  Denies anorectal discomfort or bleeding.  On physical examination today she has small grade 2 hemorrhoids without any evidence of thrombosis.  I advised conservative management at this time.  She has a remote history of GERD which is well-controlled since dietary modification after eliminating fried foods, spicy foods and hot sauce from her diet.  No medication required at this time.  She is not endorsing alarm features of dysphagia, odynophagia.    Plan  Schedule screening colonoscopy at [facility]. Conservative  management of hemorrhoids with the use of Metamucil, topical agents as needed. Continue dietary modification for management of GERD. Monitor weight and anthropometrics.   Planned Follow Up PRN  The patient or caregiver verbalized understanding of the material covered, with no barriers to understanding. All questions were answered. Patient or caregiver is agreeable with the plan outlined above.    It was a pleasure to see Amanee.  If you have any questions or concerns regarding this evaluation, do not hesitate to contact me.   I spent total of 30 minutes in both face-to-face and non-face-to-face activities, excluding procedures performed, for the visit on the date of this encounter.

## 2023-11-01 ENCOUNTER — Ambulatory Visit: Payer: 59 | Admitting: Pediatrics

## 2023-11-01 ENCOUNTER — Encounter: Payer: Self-pay | Admitting: Pediatrics

## 2023-11-01 VITALS — BP 142/88 | HR 79 | Ht 62.0 in | Wt 118.5 lb

## 2023-11-01 DIAGNOSIS — Z1211 Encounter for screening for malignant neoplasm of colon: Secondary | ICD-10-CM

## 2023-11-01 DIAGNOSIS — K649 Unspecified hemorrhoids: Secondary | ICD-10-CM

## 2023-11-01 DIAGNOSIS — K219 Gastro-esophageal reflux disease without esophagitis: Secondary | ICD-10-CM | POA: Diagnosis not present

## 2023-11-01 MED ORDER — NA SULFATE-K SULFATE-MG SULF 17.5-3.13-1.6 GM/177ML PO SOLN: 1.0000 | ORAL | 0 refills | Status: DC

## 2023-11-01 NOTE — Patient Instructions (Addendum)
 _______________________________________________________  If your blood pressure at your visit was 140/90 or greater, please contact your primary care physician to follow up on this. _______________________________________________________  If you are age 71 or older, your body mass index should be between 23-30. Your Body mass index is 21.67 kg/m. If this is out of the aforementioned range listed, please consider follow up with your Primary Care Provider. ________________________________________________________  The [facility] providers would like to encourage you to use MYCHART to communicate with providers for non-urgent requests or questions.  Due to long hold times on the telephone, sending your provider a message by [facility] may be a faster and more efficient way to get a response.  Please allow 48 business hours for a response.  Please remember that this is for non-urgent requests.  _______________________________________________________  Rosine have been scheduled for a colonoscopy. Please follow written instructions given to you at your visit today.   Please pick up your prep supplies at the pharmacy within the next 1-3 days.  If you use inhalers (even only as needed), please bring them with you on the day of your procedure.  DO NOT TAKE 7 DAYS PRIOR TO TEST- Trulicity (dulaglutide) Ozempic, Wegovy (semaglutide) Mounjaro (tirzepatide) Bydureon Bcise (exanatide extended release)  DO NOT TAKE 1 DAY PRIOR TO YOUR TEST Rybelsus (semaglutide) Adlyxin (lixisenatide) Victoza (liraglutide) Byetta (exanatide) ___________________________________________________________________________  Due to recent changes in healthcare laws, you may see the results of your imaging and laboratory studies on MyChart before your provider has had a chance to review them.  We understand that in some cases there may be results that are confusing or concerning to you. Not all laboratory results come back in the  same time frame and the provider may be waiting for multiple results in order to interpret others.  Please give us  48 hours in order for your provider to thoroughly review all the results before contacting the office for clarification of your results.   Thank you for entrusting me with your care and choosing [facility].  Dr Suzann

## 2023-11-06 ENCOUNTER — Encounter: Payer: Self-pay | Admitting: Certified Registered Nurse Anesthetist

## 2023-11-07 ENCOUNTER — Telehealth: Payer: Self-pay | Admitting: Pediatrics

## 2023-11-07 NOTE — Telephone Encounter (Signed)
 Patient called and stated that she has came down with a cold and was wondering if she needed to reschedule her procedure. Patient is requesting a call back. Please advise.

## 2023-11-07 NOTE — Telephone Encounter (Signed)
 Patient states that she has had a cold or allergies over the last couple of days and wonders if she has to reschedule her upcoming colonoscopy. Patient denies any fever, chills, myalgias or any upper respiratory symptoms other than runny nose. She also assures me that she has tested negative for covid.   Patient is advised that she should be okay to move forward with procedure at this time but should reschedule should she develop fever, additional upper respiratory symptoms or a positive COVID test. She is also asked to be sure to wear a surgical mask when she comes for her procedure. She verbalizes understanding of this information.

## 2023-11-11 ENCOUNTER — Ambulatory Visit: Payer: 59 | Admitting: Pediatrics

## 2023-11-11 ENCOUNTER — Encounter: Payer: Self-pay | Admitting: Pediatrics

## 2023-11-11 VITALS — BP 223/123 | HR 65 | Temp 98.6°F | Ht 62.0 in | Wt 118.0 lb

## 2023-11-11 MED ORDER — SODIUM CHLORIDE 0.9 % IV SOLN: 500.0000 mL | Freq: Once | INTRAVENOUS | Status: AC

## 2023-11-11 NOTE — Progress Notes (Unsigned)
Danielle Patterson colonoscopy was canceled today due to significantly elevated blood pressures 223/123.  She was evaluated by the nursing staff and CRNA who advised expedited PCP workup.  She was also counseled by our staff that should she develop symptoms of headache, chest pain or visual changes she should proceed to the emergency department immediately.

## 2023-11-11 NOTE — Progress Notes (Unsigned)
Pt's states no medical or surgical changes since previsit or office visit.  During admission it was noted that pt's BP was 223/123 Rt arm and 239-136 left arm. Pt states she took Amlodipine 5mg  and Carvedilol 6.25mg  this morning and that is all she takes for blood pressure. States her BP normally runs 150-160's systolic and 90's diastolic. States she checks her BP regularly and sees her PCP regularly, she saw him last month. Only symptom is a headache. Denies chest pain, blurred vision, dizziness. Discussed with MD and CRNA and decided to cancel the procedure for today d/t elevated BP. CRNA discussed with pt the reason for canceling. RN discussed MD recommendations with pt: to call PCP as soon as she leaves to discuss elevated BP. Also instructed pt to seek care at ED immediately if she develops symptoms mentioned above. Also discussed with pt's son. They both verbalized understanding. Pt to call to reschedule once BP is better controlled.
